# Patient Record
Sex: Female | Born: 1985 | Race: White | Hispanic: No | Marital: Single | Smoking: Never smoker
Health system: Southern US, Community
[De-identification: ages and names within clinical notes are randomized; demographics above are authoritative.]

## PROBLEM LIST (undated history)

## (undated) DIAGNOSIS — F419 Anxiety disorder, unspecified: Secondary | ICD-10-CM

## (undated) DIAGNOSIS — G510 Bell's palsy: Secondary | ICD-10-CM

## (undated) HISTORY — PX: COSMETIC SURGERY: SHX468

## (undated) HISTORY — DX: Bell's palsy: G51.0

## (undated) HISTORY — DX: Anxiety disorder, unspecified: F41.9

---
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
## 2008-01-22 ENCOUNTER — Encounter: Payer: Self-pay | Admitting: Obstetrics and Gynecology

## 2008-01-22 ENCOUNTER — Inpatient Hospital Stay (HOSPITAL_COMMUNITY): Admission: AD | Admit: 2008-01-22 | Discharge: 2008-01-23 | Payer: Self-pay | Admitting: Obstetrics and Gynecology

## 2008-01-25 ENCOUNTER — Inpatient Hospital Stay (HOSPITAL_COMMUNITY): Admission: AD | Admit: 2008-01-25 | Discharge: 2008-01-25 | Payer: Self-pay | Admitting: Family Medicine

## 2008-09-15 ENCOUNTER — Ambulatory Visit (HOSPITAL_COMMUNITY): Admission: RE | Admit: 2008-09-15 | Discharge: 2008-09-15 | Payer: Self-pay | Admitting: Obstetrics & Gynecology

## 2008-09-25 DIAGNOSIS — F419 Anxiety disorder, unspecified: Secondary | ICD-10-CM

## 2008-09-25 HISTORY — DX: Anxiety disorder, unspecified: F41.9

## 2008-10-12 ENCOUNTER — Ambulatory Visit (HOSPITAL_COMMUNITY): Admission: RE | Admit: 2008-10-12 | Discharge: 2008-10-12 | Payer: Self-pay | Admitting: Obstetrics & Gynecology

## 2008-11-12 ENCOUNTER — Ambulatory Visit (HOSPITAL_COMMUNITY): Admission: RE | Admit: 2008-11-12 | Discharge: 2008-11-12 | Payer: Self-pay | Admitting: Obstetrics & Gynecology

## 2008-11-30 ENCOUNTER — Emergency Department (HOSPITAL_COMMUNITY): Admission: EM | Admit: 2008-11-30 | Discharge: 2008-11-30 | Payer: Self-pay | Admitting: Emergency Medicine

## 2009-02-13 ENCOUNTER — Observation Stay (HOSPITAL_COMMUNITY): Admission: AD | Admit: 2009-02-13 | Discharge: 2009-02-13 | Payer: Self-pay | Admitting: Obstetrics & Gynecology

## 2009-02-16 ENCOUNTER — Inpatient Hospital Stay (HOSPITAL_COMMUNITY): Admission: AD | Admit: 2009-02-16 | Discharge: 2009-02-18 | Payer: Self-pay | Admitting: Obstetrics & Gynecology

## 2009-11-03 ENCOUNTER — Emergency Department (HOSPITAL_COMMUNITY): Admission: EM | Admit: 2009-11-03 | Discharge: 2009-11-03 | Payer: Self-pay | Admitting: Family Medicine

## 2010-05-27 ENCOUNTER — Emergency Department (HOSPITAL_COMMUNITY): Admission: AD | Admit: 2010-05-27 | Discharge: 2010-05-27 | Payer: Self-pay | Admitting: Family Medicine

## 2010-12-08 LAB — POCT RAPID STREP A (OFFICE): Streptococcus, Group A Screen (Direct): NEGATIVE

## 2011-01-03 LAB — COMPREHENSIVE METABOLIC PANEL
AST: 25 U/L (ref 0–37)
Albumin: 2.9 g/dL — ABNORMAL LOW (ref 3.5–5.2)
BUN: 4 mg/dL — ABNORMAL LOW (ref 6–23)
Calcium: 9 mg/dL (ref 8.4–10.5)
Creatinine, Ser: 0.54 mg/dL (ref 0.4–1.2)
GFR calc Af Amer: >60 mL/min (ref >60)

## 2011-01-03 LAB — CBC
HCT: 33.2 % — ABNORMAL LOW (ref 36.0–46.0)
HCT: 37.9 % (ref 36.0–46.0)
MCHC: 35.1 g/dL (ref 30.0–36.0)
MCHC: 35.1 g/dL (ref 30.0–36.0)
MCV: 89.2 fL (ref 78.0–100.0)
MCV: 90 fL (ref 78.0–100.0)
Platelets: 154 10*3/uL (ref 150–400)
Platelets: 167 10*3/uL (ref 150–400)
Platelets: 174 10*3/uL (ref 150–400)
RBC: 3.69 MIL/uL — ABNORMAL LOW (ref 3.87–5.11)
RDW: 14.1 % (ref 11.5–15.5)
RDW: 14.1 % (ref 11.5–15.5)
WBC: 11.1 10*3/uL — ABNORMAL HIGH (ref 4.0–10.5)

## 2011-01-03 LAB — URINALYSIS, DIPSTICK ONLY
Bilirubin Urine: NEGATIVE
Nitrite: NEGATIVE
Specific Gravity, Urine: 1.02 (ref 1.005–1.030)
pH: 6 (ref 5.0–8.0)

## 2011-01-03 LAB — CCBB MATERNAL DONOR DRAW

## 2011-01-03 LAB — URIC ACID: Uric Acid, Serum: 4.9 mg/dL (ref 2.4–7.0)

## 2011-01-03 LAB — RPR: RPR Ser Ql: NONREACTIVE

## 2011-02-07 NOTE — H&P (Signed)
Amanda Greer, Amanda Greer              ACCOUNT NO.:  [id number]   MEDICAL RECORD NO.:  [id number]          PATIENT TYPE:  OBV   LOCATION:  9198                          FACILITY:  [facility]   PHYSICIAN:  Roseanna Rainbow, M.D.DATE OF BIRTH:  [birth year]   DATE OF ADMISSION:  02/13/2009  DATE OF DISCHARGE:  02/13/2009                              HISTORY & PHYSICAL   CHIEF COMPLAINT:  The patient is a 25 year old para 0 with an estimated  date of confinement of Feb 11, 2009, with an intrauterine pregnancy at  40 plus weeks for induction of labor.   HISTORY OF PRESENT ILLNESS:  Please see the above.   ALLERGIES:  No known drug allergies.   MEDICATIONS:  Prenatal vitamins.   OBSTETRIC RISK FACTORS:  An ultrasound for an anatomic survey showed an  echogenic intracardiac focus.  GBS positive.   PRENATAL LABORATORY FINDINGS:  Blood type O+, antibody screen negative,  chlamydia probe negative, GC probe negative, 1-hour GCT 99, platelets  206,000, RPR nonreactive, rubella immune, varicella immune, GBS  positive.   PAST OBSTETRIC HISTORY:  History of 2 miscarriages or abortions.   PAST GYNECOLOGIC HISTORY:  Please see the above.   PAST MEDICAL HISTORY:  She denies.   PAST SURGICAL HISTORY:  There is a history of D&C.   SOCIAL HISTORY:  She works as a IT sales professional.  She is single, living  with the significant other.  She does not give any significant history  of alcohol usage.  She has no significant smoking history.  She denies  illicit drug use.   FAMILY HISTORY:  Noncontributory.   PHYSICAL EXAMINATION:  VITAL SIGNS:  Stable, afebrile.  Fetal heart  tracing reassuring.  Tocodynamometer, regular uterine contractions.  GENERAL:  No apparent distress.  ABDOMEN:  Gravid.  PELVIC:  Sterile vaginal exam, the cervix is 2 cm dilated, 90% effaced  with a vertex at a -1, 0 station.   ASSESSMENT:  Intrauterine pregnancy at term.  Favorable Bishop score for  induction of labor.  Fetal  heart tracing consistent with fetal well-  being.  Group B streptococcus positive.   PLAN:  Admission, low-dose Pitocin per protocol, penicillin, GBS  prophylaxis.      Roseanna Rainbow, M.D.  Electronically Signed     LAJ/MEDQ  D:  02/13/2009  T:  02/14/2009  Job:  [id number]

## 2011-06-20 LAB — WET PREP, GENITAL
Clue Cells Wet Prep HPF POC: NONE SEEN
Trich, Wet Prep: NONE SEEN
Yeast Wet Prep HPF POC: NONE SEEN

## 2011-06-20 LAB — POCT PREGNANCY, URINE: Operator id: 12921

## 2011-06-20 LAB — GC/CHLAMYDIA PROBE AMP, GENITAL: Chlamydia, DNA Probe: NEGATIVE

## 2014-06-04 ENCOUNTER — Encounter: Payer: Self-pay | Admitting: Physician Assistant

## 2014-06-24 ENCOUNTER — Encounter: Payer: Self-pay | Admitting: Physician Assistant

## 2014-06-24 ENCOUNTER — Ambulatory Visit (INDEPENDENT_AMBULATORY_CARE_PROVIDER_SITE_OTHER): Payer: BC Managed Care – PPO | Admitting: Physician Assistant

## 2014-06-24 VITALS — BP 120/76 | HR 96 | Temp 98.7°F | Resp 18 | Ht 65.25 in | Wt 155.0 lb

## 2014-06-24 DIAGNOSIS — D229 Melanocytic nevi, unspecified: Secondary | ICD-10-CM

## 2014-06-24 DIAGNOSIS — Z0000 Encounter for general adult medical examination without abnormal findings: Secondary | ICD-10-CM

## 2014-06-24 DIAGNOSIS — D239 Other benign neoplasm of skin, unspecified: Secondary | ICD-10-CM

## 2014-06-24 DIAGNOSIS — R635 Abnormal weight gain: Secondary | ICD-10-CM

## 2014-06-24 NOTE — Progress Notes (Signed)
Patient ID: Amanda Greer MRN: [id number], DOB: 1985/11/04, 28 y.o. Date of Encounter: 06/24/2014,   Chief Complaint: Physical (CPE)  HPI: 28 y.o. y/o white female  here for CPE.   She sees a gynecologist on a routine basis. Has GYN exam routinely there. Has IUD.  She says that she has only 2 concerns that she wanted to discuss today.  Says that she usually weighs 130 to 135. She thinks she has gained this weight over the past 3 months. Notes that she did start a new job about 4 months ago. Is now working at a bank. Says that she is sitting at this job and was up walking around at her prior job. Otherwise does not think there has been any other change in her activity level. Thinks that her diet has been the same.  Not sure why she is gaining this weight.  Also she states that her grandmother has told her that she has moles on her back that are worrying her. Wants to have these checked. Says that  a lot of family members in her dad's family have moles all over their skin.  No other complaints or concerns.   Review of Systems: Consitutional: No fever, chills, fatigue, night sweats, lymphadenopathy. No significant/unexplained weight changes. Eyes: No visual changes, eye redness, or discharge. ENT/Mouth: No ear pain, sore throat, nasal drainage, or sinus pain. Cardiovascular: No chest pressure,heaviness, tightness or squeezing, even with exertion. No increased shortness of breath or dyspnea on exertion.No palpitations, edema, orthopnea, PND. Respiratory: No cough, hemoptysis, SOB, or wheezing. Gastrointestinal: No anorexia, dysphagia, reflux, pain, nausea, vomiting, hematemesis, diarrhea, constipation, BRBPR, or melena. Breast: No mass, nodules, bulging, or retraction. No skin changes or inflammation. No nipple discharge. No lymphadenopathy. Genitourinary: No dysuria, hematuria, incontinence, vaginal discharge, pruritis, burning, abnormal bleeding, or pain. Musculoskeletal: No  decreased ROM, No joint pain or swelling. No significant pain in neck, back, or extremities. Skin: No rash, pruritis. Neurological: No headache, dizziness, syncope, seizures, tremors, memory loss, coordination problems, or paresthesias. Psychological: No anxiety, depression, hallucinations, SI/HI. Endocrine: No polydipsia, polyphagia, polyuria, or known diabetes.No increased fatigue. No palpitations/rapid heart rate. All other systems were reviewed and are otherwise negative.  Past Medical History  Diagnosis Date  . Anxiety 2010     History reviewed. No pertinent past surgical history.  Home Meds:  No outpatient prescriptions prior to visit.   No facility-administered medications prior to visit.    Allergies: No Known Allergies  History   Social History  . Marital Status: Single    Spouse Name: N/A    Number of Children: N/A  . Years of Education: N/A   Occupational History  . Not on file.   Social History Main Topics  . Smoking status: Never Smoker   . Smokeless tobacco: Never Used  . Alcohol Use: No  . Drug Use: No  . Sexual Activity: Yes    Birth Control/ Protection: IUD   Other Topics Concern  . Not on file   Social History Narrative   Entered 05/2014:    At home, It is patient's daughter, and daughter's father. It is the 3 of them at home.   Patient currently working at a bank.    Family History  Problem Relation Age of Onset  . Hyperlipidemia Mother   . Hypertension Mother     Physical Exam: Blood pressure 120/76, pulse 96, temperature 98.7 F (37.1 C), temperature source Oral, resp. rate 18, height 5' 5.25" (1.657 m), weight 155  lb (70.308 kg), last menstrual period 06/23/2014., Body mass index is 25.61 kg/(m^2). General: Well developed, well nourished, WF. Appears in no acute distress. HEENT: Normocephalic, atraumatic. Conjunctiva pink, sclera non-icteric. Pupils 2 mm constricting to 1 mm, round, regular, and equally reactive to light and  accomodation. EOMI. Internal auditory canal clear. TMs with good cone of light and without pathology. Nasal mucosa pink. Nares are without discharge. No sinus tenderness. Oral mucosa pink.  Pharynx without exudate.   Neck: Supple. Trachea midline. No thyromegaly. Full ROM. No lymphadenopathy.No Carotid Bruits. Lungs: Clear to auscultation bilaterally without wheezes, rales, or rhonchi. Breathing is of normal effort and unlabored. Cardiovascular: RRR with S1 S2. No murmurs, rubs, or gallops. Distal pulses 2+ symmetrically. No carotid or abdominal bruits. Breast: Deferred. Per Gyn. Abdomen: Soft, non-tender, non-distended with normoactive bowel sounds. No hepatosplenomegaly or masses. No rebound/guarding. No CVA tenderness. No hernias.  Genitourinary: Deferred. Per Gyn.  Musculoskeletal: Full range of motion and 5/5 strength throughout. Without swelling, atrophy, tenderness, crepitus, or warmth. Extremities without clubbing, cyanosis, or edema. Calves supple. Skin: Warm and moist without erythema, ecchymosis, wounds, or rash. She has nevi on all areas of her body--arms, chest, abdomen, back, etc. On her back, she has 3 suspicious nevi--with irregular borders, different shades of brown pigment. Neuro: A+Ox3. CN II-XII grossly intact. Moves all extremities spontaneously. Full sensation throughout. Normal gait. DTR 2+ throughout upper and lower extremities. Finger to nose intact. Psych:  Responds to questions appropriately with a normal affect.   Assessment/Plan:  28 y.o. y/o female here for CPE 1. Visit for preventive health examination  A. Screening Labs: Will  not check lipid panel. She is not fasting and do not think she needs to return fasting as she is only 28 years old. No cardiac risk factors. - CBC with Differential - COMPLETE METABOLIC PANEL WITH GFR - TSH  B. Pap: She has this with GYN.  F. Immunizations:  Influenza: Recommended this today but she defers. Tetanus: She states that  this is up to date. 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 Pneumococcal: No indication to receive this until age 28. Zostavax: Not recommended until age 26.  2. Weight gain - TSH Discussed that if her TSH is normal then the weight gain is secondary to age, hormones, diet and exercise.  If TSH is normal then she will need to improve her diet and exercise for weight management.  3. Multiple atypical nevi - Ambulatory referral to Dermatology   Signed, [place], [place], [facility] 06/24/2014 3:27 PM

## 2014-06-25 ENCOUNTER — Telehealth: Payer: Self-pay | Admitting: *Deleted

## 2014-06-25 LAB — CBC WITH DIFFERENTIAL/PLATELET
BASOS ABS: 0 10*3/uL (ref 0.0–0.1)
BASOS PCT: 0 % (ref 0–1)
Eosinophils Absolute: 0.4 10*3/uL (ref 0.0–0.7)
Eosinophils Relative: 6 % — ABNORMAL HIGH (ref 0–5)
HCT: 41.7 % (ref 36.0–46.0)
Hemoglobin: 14.3 g/dL (ref 12.0–15.0)
Lymphocytes Relative: 33 % (ref 12–46)
Lymphs Abs: 2 10*3/uL (ref 0.7–4.0)
MCH: 29.6 pg (ref 26.0–34.0)
MCHC: 34.3 g/dL (ref 30.0–36.0)
MCV: 86.3 fL (ref 78.0–100.0)
Monocytes Absolute: 0.7 10*3/uL (ref 0.1–1.0)
Monocytes Relative: 11 % (ref 3–12)
NEUTROS ABS: 3.1 10*3/uL (ref 1.7–7.7)
NEUTROS PCT: 50 % (ref 43–77)
Platelets: 250 10*3/uL (ref 150–400)
RBC: 4.83 MIL/uL (ref 3.87–5.11)
RDW: 13.2 % (ref 11.5–15.5)
WBC: 6.2 10*3/uL (ref 4.0–10.5)

## 2014-06-25 LAB — COMPLETE METABOLIC PANEL WITH GFR
ALBUMIN: 4.6 g/dL (ref 3.5–5.2)
ALK PHOS: 41 U/L (ref 39–117)
ALT: 17 U/L (ref 0–35)
AST: 17 U/L (ref 0–37)
BUN: 10 mg/dL (ref 6–23)
CO2: 25 mEq/L (ref 19–32)
Calcium: 9.4 mg/dL (ref 8.4–10.5)
Chloride: 103 mEq/L (ref 96–112)
Creat: 0.62 mg/dL (ref 0.50–1.10)
GFR, Est African American: >89 mL/min
GFR, Est Non African American: >89 mL/min
Glucose, Bld: 87 mg/dL (ref 70–99)
POTASSIUM: 3.8 meq/L (ref 3.5–5.3)
SODIUM: 139 meq/L (ref 135–145)
TOTAL PROTEIN: 7.1 g/dL (ref 6.0–8.3)
Total Bilirubin: 0.3 mg/dL (ref 0.2–1.2)

## 2014-06-25 LAB — TSH: TSH: 2.375 u[IU]/mL (ref 0.350–4.500)

## 2014-06-25 NOTE — Telephone Encounter (Signed)
LM to return call, pt has appt set up with Verlan Friends on Oct 22 at 10:20am with Dr. Elvera Lennox.

## 2014-06-26 NOTE — Telephone Encounter (Signed)
Lm on pt vm of appointment date time and location of appt

## 2014-06-30 ENCOUNTER — Encounter: Payer: Self-pay | Admitting: Family Medicine

## 2014-08-31 ENCOUNTER — Other Ambulatory Visit: Payer: Self-pay | Admitting: Dermatology

## 2014-11-03 ENCOUNTER — Other Ambulatory Visit: Payer: Self-pay | Admitting: Dermatology

## 2015-07-15 ENCOUNTER — Encounter: Payer: Self-pay | Admitting: Physician Assistant

## 2015-07-15 ENCOUNTER — Ambulatory Visit (INDEPENDENT_AMBULATORY_CARE_PROVIDER_SITE_OTHER): Payer: BLUE CROSS/BLUE SHIELD | Admitting: Physician Assistant

## 2015-07-15 VITALS — BP 114/78 | HR 88 | Temp 98.7°F | Resp 18 | Ht 65.0 in | Wt 166.0 lb

## 2015-07-15 DIAGNOSIS — Z0000 Encounter for general adult medical examination without abnormal findings: Secondary | ICD-10-CM | POA: Diagnosis not present

## 2015-07-15 DIAGNOSIS — Z124 Encounter for screening for malignant neoplasm of cervix: Secondary | ICD-10-CM | POA: Diagnosis not present

## 2015-07-15 DIAGNOSIS — Z01419 Encounter for gynecological examination (general) (routine) without abnormal findings: Secondary | ICD-10-CM

## 2015-07-15 NOTE — Progress Notes (Signed)
Patient ID: Amanda Greer MRN: [id number], DOB: 1986-07-16, 29 y.o. Date of Encounter: 07/15/2015,   Chief Complaint: Physical (CPE)  HPI: 29 y.o. y/o white female  here for CPE.   She states that she does have a gynecologist and does have an IUD. States that her IUD expires February 2017 and she does have an appointment with her gynecologist for them to change this out. However she says that she also has been keeping up with her Pap smears and knows that her last Pap smear was > 3 years ago and that she does need to go ahead and do Pap smear here today. We had a discussion of whether the gynecologist was doing this and had done this recently and she says again that she has been keeping up with this and is due for this today.  Again today she is frustrated with weight gain. She asked how much weight she has gained since her last visit here. Says that she feels that she is pretty careful with her diet and just doesn't understand why she is gaining weight at the rate she is.  Her daughter is now 44 years old.   Review of Systems: Consitutional: No fever, chills, fatigue, night sweats, lymphadenopathy. No significant/unexplained weight changes. Eyes: No visual changes, eye redness, or discharge. ENT/Mouth: No ear pain, sore throat, nasal drainage, or sinus pain. Cardiovascular: No chest pressure,heaviness, tightness or squeezing, even with exertion. No increased shortness of breath or dyspnea on exertion.No palpitations, edema, orthopnea, PND. Respiratory: No cough, hemoptysis, SOB, or wheezing. Gastrointestinal: No anorexia, dysphagia, reflux, pain, nausea, vomiting, hematemesis, diarrhea, constipation, BRBPR, or melena. Breast: No mass, nodules, bulging, or retraction. No skin changes or inflammation. No nipple discharge. No lymphadenopathy. Genitourinary: No dysuria, hematuria, incontinence, vaginal discharge, pruritis, burning, abnormal bleeding, or pain. Musculoskeletal: No  decreased ROM, No joint pain or swelling. No significant pain in neck, back, or extremities. Skin: No rash, pruritis, or concerning lesions. Neurological: No headache, dizziness, syncope, seizures, tremors, memory loss, coordination problems, or paresthesias. Psychological: No anxiety, depression, hallucinations, SI/HI. Endocrine: No polydipsia, polyphagia, polyuria, or known diabetes.No increased fatigue. No palpitations/rapid heart rate. No significant/unexplained weight change. All other systems were reviewed and are otherwise negative.  Past Medical History  Diagnosis Date  . Anxiety 2010     History reviewed. No pertinent past surgical history.  Home Meds:  No outpatient prescriptions prior to visit.   No facility-administered medications prior to visit.    Allergies: No Known Allergies  Social History   Social History  . Marital Status: Single    Spouse Name: N/A  . Number of Children: N/A  . Years of Education: N/A   Occupational History  . Not on file.   Social History Main Topics  . Smoking status: Never Smoker   . Smokeless tobacco: Never Used  . Alcohol Use: No  . Drug Use: No  . Sexual Activity: Yes    Birth Control/ Protection: IUD   Other Topics Concern  . Not on file   Social History Narrative   Entered 05/2014:    At home, It is patient's daughter, and daughter's father. It is the 3 of them at home.   Patient currently working at a bank.    Family History  Problem Relation Age of Onset  . Hyperlipidemia Mother   . Hypertension Mother     Physical Exam: Blood pressure 114/78, pulse 88, temperature 98.7 F (37.1 C), temperature source Oral, resp. rate 18, height 5'  5" (1.651 m), weight 166 lb (75.297 kg)., Body mass index is 27.62 kg/(m^2). General: Well developed, well nourished, WF. Appears in no acute distress. HEENT: Normocephalic, atraumatic. Conjunctiva pink, sclera non-icteric. Pupils 2 mm constricting to 1 mm, round, regular, and  equally reactive to light and accomodation. EOMI. Internal auditory canal clear. TMs with good cone of light and without pathology. Nasal mucosa pink. Nares are without discharge. No sinus tenderness. Oral mucosa pink.  Pharynx without exudate.   Neck: Supple. Trachea midline. No thyromegaly. Full ROM. No lymphadenopathy.No Carotid Bruits. Lungs: Clear to auscultation bilaterally without wheezes, rales, or rhonchi. Breathing is of normal effort and unlabored. Cardiovascular: RRR with S1 S2. No murmurs, rubs, or gallops. Distal pulses 2+ symmetrically. No carotid or abdominal bruits. Breast: Symmetrical. No masses. Nipples without discharge. Abdomen: Soft, non-tender, non-distended with normoactive bowel sounds. No hepatosplenomegaly or masses. No rebound/guarding. No CVA tenderness. No hernias.  Genitourinary:  External genitalia without lesions. Vaginal mucosa pink.No discharge present. Cervix pink and without discharge. No cervical tenderness.Normal uterus size. No adnexal mass or tenderness.  Pap smear taken. Musculoskeletal: Full range of motion and 5/5 strength throughout. Skin: Warm and moist without erythema, ecchymosis, wounds, or rash. Neuro: A+Ox3. CN II-XII grossly intact. Moves all extremities spontaneously. Full sensation throughout. Normal gait. Psych:  Responds to questions appropriately with a normal affect.   Assessment/Plan:  29 y.o. y/o female here for CPE  1. Visit for preventive health examination  A. Screening Labs: She had screening labs performed last year and these were normal. She does not want to repeat today.  B. Pap: - PAP, ThinPrep ASCUS Rflx HPV Rflx Type  C. Screening Mammogram: Not indicated at this age  D. DEXA/BMD:  Not indicated at this age  E. Colorectal Cancer Screening: Not indicated at this age  F. Immunizations:  Influenza:----- recommended influenza vaccine but she defers Tetanus:------ last tetanus was 09/25/2006----up to date---next one due  2018 Pneumococcal:--- She has no indication to need pneumonia vaccine until age 7 Zostavax:----------- not indicated until age 25   2. Encounter for cervical Pap smear with pelvic exam - PAP, ThinPrep ASCUS Rflx HPV Rflx Type   Signed, [address] [place], [place], [facility] 07/15/2015 9:42 AM

## 2015-07-16 ENCOUNTER — Encounter: Payer: Self-pay | Admitting: Family Medicine

## 2015-07-16 LAB — PAP THINPREP ASCUS RFLX HPV RFLX TYPE

## 2015-10-20 ENCOUNTER — Ambulatory Visit (INDEPENDENT_AMBULATORY_CARE_PROVIDER_SITE_OTHER): Payer: BLUE CROSS/BLUE SHIELD | Admitting: Physician Assistant

## 2015-10-20 ENCOUNTER — Encounter: Payer: Self-pay | Admitting: Physician Assistant

## 2015-10-20 VITALS — BP 110/78 | HR 80 | Temp 98.2°F | Resp 20 | Wt 165.0 lb

## 2015-10-20 DIAGNOSIS — L309 Dermatitis, unspecified: Secondary | ICD-10-CM | POA: Diagnosis not present

## 2015-10-20 MED ORDER — CLOTRIMAZOLE-BETAMETHASONE 1-0.05 % EX CREA: 1.0000 "application " | TOPICAL_CREAM | Freq: Two times a day (BID) | CUTANEOUS | Status: DC

## 2015-10-20 NOTE — Progress Notes (Signed)
    Patient ID: Amanda Greer MRN: [id number], DOB: 12/28/1985, 30 y.o. Date of Encounter: 10/20/2015, 8:57 AM    Chief Complaint:  Chief Complaint  Patient presents with  . Rash    on face under left chin and under nose and under both eyes since Oct and getting worse     HPI: 30 y.o. year old white female presents with above.  Says that she has been having this since October off and on. Says that some days are better than others. Says some days the rash seems "thick" scaly. On her chin and says sometimes it's even on her lips. Says that she has not changed any products. Says that she has only been using Vaseline and only using Carmex to the lips. Does not think it's any products that she is applying that is an irritant.     Home Meds:   No outpatient prescriptions prior to visit.   No facility-administered medications prior to visit.    Allergies: No Known Allergies    Review of Systems: See HPI for pertinent ROS. All other ROS negative.    Physical Exam: Blood pressure 110/78, pulse 80, temperature 98.2 F (36.8 C), temperature source Oral, resp. rate 20, weight 165 lb (74.844 kg)., Body mass index is 27.46 kg/(m^2). General:  WNWD. WF. Appears in no acute distress. Neck: Supple. No thyromegaly. No lymphadenopathy. Lungs: Clear bilaterally to auscultation without wheezes, rales, or rhonchi. Breathing is unlabored. Heart: Regular rhythm. No murmurs, rubs, or gallops. Msk:  Strength and tone normal for age. Skin: Chin: Currently, approx 2 cm area affected-- 1 cm area of diffuse light pink area, with small pink satellite lesions around lateral periphery. Currently lips normal.  Neuro: Alert and oriented X 3. Moves all extremities spontaneously. Gait is normal. CNII-XII grossly in tact. Psych:  Responds to questions appropriately with a normal affect.     ASSESSMENT AND PLAN:  30 y.o. year old female with  1. Dermatitis Apply Lotrisone twice a day. If does not resolve  with this, will consider dermatology referral. - clotrimazole-betamethasone (LOTRISONE) cream; Apply 1 application topically 2 (two) times daily.  Dispense: 30 g; Refill: 0   Signed, [address] [place], [place], [facility] 10/20/2015 8:57 AM

## 2015-11-16 ENCOUNTER — Ambulatory Visit (INDEPENDENT_AMBULATORY_CARE_PROVIDER_SITE_OTHER): Payer: BLUE CROSS/BLUE SHIELD | Admitting: Certified Nurse Midwife

## 2015-11-16 VITALS — BP 119/85 | HR 85 | Wt 164.0 lb

## 2015-11-16 DIAGNOSIS — Z30430 Encounter for insertion of intrauterine contraceptive device: Secondary | ICD-10-CM

## 2015-11-16 DIAGNOSIS — Z30433 Encounter for removal and reinsertion of intrauterine contraceptive device: Secondary | ICD-10-CM | POA: Diagnosis not present

## 2015-11-16 NOTE — Progress Notes (Signed)
Patient ID: Amanda Greer, female   DOB: 09-Jan-1986, 30 y.o.   MRN: [id number]  IUD Procedure Note   DIAGNOSIS: Desires long-term, reversible contraception   PROCEDURE: IUD removal and replacement Performing Provider: Kandis Cocking CNM  Patient counseled prior to procedure. I explained risks and benefits of Mirena IUD, reviewed alternative forms of contraception. Patient stated understanding and consented to continue with procedure.   LMP: unknown, Mirena Pregnancy Test: Negative Lot #: [id number] Expiration Date: 11/18   IUD type: [X]  Mirena   [  ] Paraguard  [  ] Isla Pence   [  ]  Kyleena  PROCEDURE:  Timeout procedure was performed to ensure right patient and right site.  A bimanual exam was performed to determine the position of the uterus, retroverted. The speculum was placed. The vagina and cervix was sterilized in the usual manner and sterile technique was maintained throughout the course of the procedure. A cyto brush was used along with long forceps to grasp IUD strings.  IUD strings visualized and IUD strings grasped with long forceps, removed intact.  A single toothed tenaculum was not required. The depth of the uterus was sounded to 9 cm. The IUD was inserted to the appropriate depth and inserted without difficulty.  The string was cut to an estimated 4 cm length. Bleeding was minimal. The patient tolerated the procedure well.   Follow up: The patient tolerated the procedure well without complications.  Standard post-procedure care is explained and return precautions are given.  Kandis Cocking CNM

## 2015-12-15 ENCOUNTER — Ambulatory Visit (INDEPENDENT_AMBULATORY_CARE_PROVIDER_SITE_OTHER): Payer: BLUE CROSS/BLUE SHIELD | Admitting: Certified Nurse Midwife

## 2015-12-15 ENCOUNTER — Encounter: Payer: Self-pay | Admitting: Certified Nurse Midwife

## 2015-12-15 VITALS — BP 115/88 | HR 80 | Wt 164.0 lb

## 2015-12-15 DIAGNOSIS — Z30431 Encounter for routine checking of intrauterine contraceptive device: Secondary | ICD-10-CM | POA: Diagnosis not present

## 2015-12-15 DIAGNOSIS — R399 Unspecified symptoms and signs involving the genitourinary system: Secondary | ICD-10-CM | POA: Diagnosis not present

## 2015-12-15 LAB — POCT URINALYSIS DIPSTICK
Bilirubin, UA: NEGATIVE
GLUCOSE UA: NEGATIVE
Ketones, UA: NEGATIVE
NITRITE UA: NEGATIVE
Spec Grav, UA: 1.02
UROBILINOGEN UA: NEGATIVE
pH, UA: 5

## 2015-12-15 NOTE — Progress Notes (Signed)
Patient ID: Amanda Greer, female   DOB: 1986-02-16, 30 y.o.   MRN: [id number]  Chief Complaint  Patient presents with  . Follow-up    IUD check-Mirena    HPI Amanda Greer Amanda Greer is a 30 y.o. female.  Here for 1 mo. F/u on Mirena IUD.  Desires to have strings trimmed a little bit shorter.  Partner is feeling them during sexual intercourse.  States that she is not having any problems with the IUD. Has had 2 episodes of spotting since insertion.    HPI  Past Medical History  Diagnosis Date  . Anxiety 2010    No past surgical history on file.  Family History  Problem Relation Age of Onset  . Hyperlipidemia Mother   . Hypertension Mother     Social History Social History  Substance Use Topics  . Smoking status: Never Smoker   . Smokeless tobacco: Never Used  . Alcohol Use: No    No Known Allergies  Current Outpatient Prescriptions  Medication Sig Dispense Refill  . clotrimazole-betamethasone (LOTRISONE) cream Apply 1 application topically 2 (two) times daily. (Patient not taking: Reported on 11/16/2015) 30 g 0   No current facility-administered medications for this visit.    Review of Systems Review of Systems Constitutional: negative for fatigue and weight loss Respiratory: negative for cough and wheezing Cardiovascular: negative for chest pain, fatigue and palpitations Gastrointestinal: negative for abdominal pain and change in bowel habits Genitourinary:negative Integument/breast: negative for nipple discharge Musculoskeletal:negative for myalgias Neurological: negative for gait problems and tremors Behavioral/Psych: negative for abusive relationship, depression Endocrine: negative for temperature intolerance     Blood pressure 115/88, pulse 80, weight 164 lb (74.39 kg).  Physical Exam Physical Exam General:   alert  Skin:   no rash or abnormalities  Lungs:   clear to auscultation bilaterally  Heart:   regular rate and rhythm, S1, S2 normal, no murmur, click,  rub or gallop  Breasts:   deferred  Abdomen:  normal findings: no organomegaly, soft, non-tender and no hernia  Pelvis:  External genitalia: normal general appearance Urinary system: urethral meatus normal and bladder without fullness, nontender Vaginal: normal without tenderness, induration or masses Cervix: normal appearance, + IUD strings present Adnexa: normal bimanual exam Uterus: anteverted and non-tender, normal size    50% of 15 min visit spent on counseling and coordination of care.   Data Reviewed Previous medical hx, meds, labs  Assessment     Hematuria IUD check up, in place     Plan    Orders Placed This Encounter  Procedures  . Urine culture  . POCT urinalysis dipstick   No orders of the defined types were placed in this encounter.    Follow up as needed or in 1 year for annual exam.

## 2015-12-16 LAB — URINE CULTURE

## 2016-07-17 ENCOUNTER — Encounter: Payer: Self-pay | Admitting: Physician Assistant

## 2016-07-17 ENCOUNTER — Ambulatory Visit (INDEPENDENT_AMBULATORY_CARE_PROVIDER_SITE_OTHER): Payer: BLUE CROSS/BLUE SHIELD | Admitting: Physician Assistant

## 2016-07-17 VITALS — BP 128/88 | HR 108 | Temp 99.0°F | Resp 16 | Wt 170.0 lb

## 2016-07-17 DIAGNOSIS — F411 Generalized anxiety disorder: Secondary | ICD-10-CM

## 2016-07-17 DIAGNOSIS — Z0000 Encounter for general adult medical examination without abnormal findings: Secondary | ICD-10-CM | POA: Diagnosis not present

## 2016-07-17 DIAGNOSIS — F410 Panic disorder [episodic paroxysmal anxiety] without agoraphobia: Secondary | ICD-10-CM | POA: Diagnosis not present

## 2016-07-17 LAB — CBC WITH DIFFERENTIAL/PLATELET
BASOS ABS: 0 {cells}/uL (ref 0–200)
Basophils Relative: 0 %
EOS PCT: 4 %
Eosinophils Absolute: 256 cells/uL (ref 15–500)
HCT: 43 % (ref 35.0–45.0)
Hemoglobin: 14.5 g/dL (ref 12.0–15.0)
LYMPHS ABS: 1664 {cells}/uL (ref 850–3900)
Lymphocytes Relative: 26 %
MCH: 29.7 pg (ref 27.0–33.0)
MCHC: 33.7 g/dL (ref 32.0–36.0)
MCV: 88.1 fL (ref 80.0–100.0)
MONOS PCT: 8 %
MPV: 11.5 fL (ref 7.5–12.5)
Monocytes Absolute: 512 cells/uL (ref 200–950)
NEUTROS ABS: 3968 {cells}/uL (ref 1500–7800)
NEUTROS PCT: 62 %
PLATELETS: 270 10*3/uL (ref 140–400)
RBC: 4.88 MIL/uL (ref 3.80–5.10)
RDW: 13.4 % (ref 11.0–15.0)
WBC: 6.4 10*3/uL (ref 3.8–10.8)

## 2016-07-17 LAB — TSH: TSH: 3.78 m[IU]/L

## 2016-07-17 MED ORDER — SERTRALINE HCL 50 MG PO TABS: 50.0000 mg | ORAL_TABLET | Freq: Every day | ORAL | 1 refills | Status: DC

## 2016-07-17 MED ORDER — CLONAZEPAM 0.5 MG PO TABS: 0.5000 mg | ORAL_TABLET | Freq: Two times a day (BID) | ORAL | 1 refills | Status: DC | PRN

## 2016-07-17 NOTE — Progress Notes (Signed)
Patient ID: Amanda Greer MRN: [id number], DOB: Mar 26, 1986, 30 y.o. Date of Encounter: 07/17/2016,   Chief Complaint: Physical (CPE)  HPI: 30 y.o. y/o female  here for CPE.   Today when depression screen was performed she had a high score on this. I then noted this to patient and she started to become teary-eyed. Says that she has been having panic attacks over the last couple of months.  Says that the first one was in August. At that time she was in a restaurant with a live band. Her hands started getting sweaty, she felt like she was going to throw up. They had to leave. Says that sometimes she is just lying in bed and will suddenly have a panic attack -- feels like she cannot breathe. Says now she is afraid to go out to eat because is afraid it will happen again. Says that she has an 34-year-old daughter and needs to go do things with her but is afraid to go out of the house and afraid that she will have a panic attack somewhere and it will be embarrassing. Says that sometimes her mind starts racing and it will not stop and she feels anxious. Says that yesterday morning she was just cooking breakfast and felt high anxiety and could not control her mind. Says that everything with her life is good and she has nothing to be worried and anxious about. 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 Says they have bought a home and that is good. Says that she is not married because she chooses not to be-- but that she is with the same man for 11 years and that that is a monogamous relationship and they have a very strong good relationship. She has an 69-year-old daughter who is healthy and does very well in school and says that everything is good and she has nothing to be worried about. Says in the past she has been through things that are stressful--  buying a house etc. things that should have caused anxiety didn't and now everything is totally stable she has no reason to be  feeling this way. She says that when she is at work working as a Customer service manager she is okay because she is focused on her work and so her mind is staying busy and focused. However when she is at home she is constantly worried and afraid of having panic attack. Says that she has been reading about this and has started taking some B vitamins and has been doing aromatherapy and reads books to try to calm herself. However these measures are not controlling the panic and anxiety.  She has no other complaints or concerns today.  She did see gynecologist in [place] and they changed out her IUD. Does not need GYN exam today as she has had that with her gynecologist.  Review of Systems: Consitutional: No fever, chills, fatigue, night sweats, lymphadenopathy. No significant/unexplained weight changes. Eyes: No visual changes, eye redness, or discharge. ENT/Mouth: No ear pain, sore throat, nasal drainage, or sinus pain. Cardiovascular: No chest pressure,heaviness, tightness or squeezing, even with exertion. No increased shortness of breath or dyspnea on exertion.No palpitations, edema, orthopnea, PND. Respiratory: No cough, hemoptysis, SOB, or wheezing. Gastrointestinal: No anorexia, dysphagia, reflux, pain, nausea, vomiting, hematemesis, diarrhea, constipation, BRBPR, or melena. Breast: No mass, nodules, bulging, or retraction. No skin changes or inflammation. No nipple discharge. No lymphadenopathy. Genitourinary: No dysuria, hematuria, incontinence, vaginal discharge, pruritis, burning, abnormal  bleeding, or pain. Musculoskeletal: No decreased ROM, No joint pain or swelling. No significant pain in neck, back, or extremities. Skin: No rash, pruritis, or concerning lesions. Neurological: No headache, dizziness, syncope, seizures, tremors, memory loss, coordination problems, or paresthesias. Psychological: No  hallucinations, SI/HI. See HPI Endocrine: No polydipsia, polyphagia, polyuria, or known  diabetes.No increased fatigue. No palpitations/rapid heart rate. No significant/unexplained weight change. All other systems were reviewed and are otherwise negative.  Past Medical History:  Diagnosis Date  . Anxiety 2010     No past surgical history on file.  Home Meds:  Outpatient Medications Prior to Visit  Medication Sig Dispense Refill  . clotrimazole-betamethasone (LOTRISONE) cream Apply 1 application topically 2 (two) times daily. (Patient not taking: Reported on 07/17/2016) 30 g 0   No facility-administered medications prior to visit.     Allergies: No Known Allergies  Social History   Social History  . Marital status: Single    Spouse name: N/A  . Number of children: N/A  . Years of education: N/A   Occupational History  . Not on file.   Social History Main Topics  . Smoking status: Never Smoker  . Smokeless tobacco: Never Used  . Alcohol use No  . Drug use: No  . Sexual activity: Yes    Birth control/ protection: IUD   Other Topics Concern  . Not on file   Social History Narrative   Entered 05/2014:    At home, It is patient's daughter, and daughter's father. It is the 3 of them at home.   Patient currently working at a bank.    Family History  Problem Relation Age of Onset  . Hyperlipidemia Mother   . Hypertension Mother     Physical Exam: Blood pressure 128/88, pulse (!) 108, temperature 99 F (37.2 C), temperature source Oral, resp. rate 16, weight 170 lb (77.1 kg), SpO2 99 %., Body mass index is 28.29 kg/m. General: Well developed, well nourished,WF. Appears in no acute distress. HEENT: Normocephalic, atraumatic. Conjunctiva pink, sclera non-icteric. Pupils 2 mm constricting to 1 mm, round, regular, and equally reactive to light and accomodation. EOMI. Internal auditory canal clear. TMs with good cone of light and without pathology. Nasal mucosa pink. Nares are without discharge. No sinus tenderness. Oral mucosa pink.  Pharynx without exudate.      Neck: Supple. Trachea midline. No thyromegaly. Full ROM. No lymphadenopathy.No Carotid Bruits. Lungs: Clear to auscultation bilaterally without wheezes, rales, or rhonchi. Breathing is of normal effort and unlabored. Cardiovascular: RRR with S1 S2. No murmurs, rubs, or gallops. Distal pulses 2+ symmetrically. No carotid or abdominal bruits. Breast: Per Gyn Abdomen: Soft, non-tender, non-distended with normoactive bowel sounds. No hepatosplenomegaly or masses. No rebound/guarding. No CVA tenderness. No hernias.  Genitourinary:  Per Gyn. Musculoskeletal: Full range of motion and 5/5 strength throughout.  Skin: Warm and moist without erythema, ecchymosis, wounds, or rash. Neuro: A+Ox3. CN II-XII grossly intact. Moves all extremities spontaneously. Full sensation throughout. Normal gait. DTR 2+ throughout upper and lower extremities. Psych: She is teary-eyed talking about her anxiety, panic. Otherwise is very appropriate.  Responds to questions appropriately.   Assessment/Plan:  30 y.o. y/o female here for CPE  1. Encounter for preventive health examination  A. Screening Labs:  She does want to recheck screening labs today. He had very small amount of apple juice this morning otherwise is fasting. - CBC with Differential/Platelet - COMPLETE METABOLIC PANEL WITH GFR - Lipid panel - TSH - VITAMIN D 25 Hydroxy (Vit-D  Deficiency, Fractures)  B. Pap: She had Pap smear here 07/15/15 which was negative. She has seen gynecologist in January/February 2017.  C. Screening Mammogram: Not indicated until at least age 78 D. DEXA/BMD:  Not indicated until around age 40 E. Colorectal Cancer Screening: Not indicated until age 68 F. Immunizations:  Influenza:----- recommended today but she defers Tetanus: This is up to date Pneumococcal: She has no indication to require pneumonia vaccine until age 43 Zostavax: Not indicated until age 45   2. Panic disorder Discussed proper expectations of  each medication at length. Discussed that she is to take the Zoloft daily until follow-up visit in 6 weeks. If this is causing any adverse effects and she is to call me immediately. Otherwise even if she does not think this is being effective she is to continue this until her follow-up visit with me in 6 weeks. She can use the Klonopin as needed for anxiety/panic. If she takes 1 and then 15 minutes later has felt no effect can take another one. - sertraline (ZOLOFT) 50 MG tablet; Take 1 tablet (50 mg total) by mouth daily.  Dispense: 30 tablet; Refill: 1 - clonazePAM (KLONOPIN) 0.5 MG tablet; Take 1 tablet (0.5 mg total) by mouth 2 (two) times daily as needed for anxiety.  Dispense: 20 tablet; Refill: 1  3. Generalized anxiety disorder Discussed proper expectations of each medication at length. Discussed that she is to take the Zoloft daily until follow-up visit in 6 weeks. If this is causing any adverse effects and she is to call me immediately. Otherwise even if she does not think this is being effective she is to continue this until her follow-up visit with me in 6 weeks. She can use the Klonopin as needed for anxiety/panic. If she takes 1 and then 15 minutes later has felt no effect can take another one.  - sertraline (ZOLOFT) 50 MG tablet; Take 1 tablet (50 mg total) by mouth daily.  Dispense: 30 tablet; Refill: 1 - clonazePAM (KLONOPIN) 0.5 MG tablet; Take 1 tablet (0.5 mg total) by mouth 2 (two) times daily as needed for anxiety.  Dispense: 20 tablet; Refill: 1    Follow up office visit 6 weeks or sooner if needed.  Signed, [address] [place], [place], [facility] 07/17/2016 9:29 AM

## 2016-07-18 LAB — COMPLETE METABOLIC PANEL WITH GFR
ALT: 17 U/L (ref 6–29)
AST: 17 U/L (ref 10–30)
Albumin: 4.3 g/dL (ref 3.6–5.1)
Alkaline Phosphatase: 45 U/L (ref 33–115)
BUN: 13 mg/dL (ref 7–25)
CHLORIDE: 103 mmol/L (ref 98–110)
CO2: 27 mmol/L (ref 20–31)
Calcium: 9.2 mg/dL (ref 8.6–10.2)
Creat: 0.83 mg/dL (ref 0.50–1.10)
GFR, Est African American: >89 mL/min (ref >=60)
GLUCOSE: 92 mg/dL (ref 70–99)
POTASSIUM: 3.9 mmol/L (ref 3.5–5.3)
SODIUM: 140 mmol/L (ref 135–146)
Total Bilirubin: 0.5 mg/dL (ref 0.2–1.2)
Total Protein: 7.1 g/dL (ref 6.1–8.1)

## 2016-07-18 LAB — LIPID PANEL
CHOL/HDL RATIO: 4.3 ratio (ref <=5.0)
Cholesterol: 179 mg/dL (ref 125–200)
HDL: 42 mg/dL — AB (ref >=46)
LDL CALC: 110 mg/dL (ref <130)
Triglycerides: 137 mg/dL (ref <150)
VLDL: 27 mg/dL (ref <30)

## 2016-07-18 LAB — VITAMIN D 25 HYDROXY (VIT D DEFICIENCY, FRACTURES): Vit D, 25-Hydroxy: 23 ng/mL — ABNORMAL LOW (ref 30–100)

## 2016-08-28 ENCOUNTER — Encounter: Payer: Self-pay | Admitting: Physician Assistant

## 2016-08-28 ENCOUNTER — Ambulatory Visit (INDEPENDENT_AMBULATORY_CARE_PROVIDER_SITE_OTHER): Payer: BLUE CROSS/BLUE SHIELD | Admitting: Physician Assistant

## 2016-08-28 VITALS — BP 120/88 | HR 83 | Temp 98.3°F | Resp 16 | Wt 174.0 lb

## 2016-08-28 DIAGNOSIS — L700 Acne vulgaris: Secondary | ICD-10-CM | POA: Diagnosis not present

## 2016-08-28 DIAGNOSIS — F411 Generalized anxiety disorder: Secondary | ICD-10-CM | POA: Diagnosis not present

## 2016-08-28 DIAGNOSIS — F410 Panic disorder [episodic paroxysmal anxiety] without agoraphobia: Secondary | ICD-10-CM

## 2016-08-28 MED ORDER — SERTRALINE HCL 50 MG PO TABS: 50.0000 mg | ORAL_TABLET | Freq: Every day | ORAL | 1 refills | Status: DC

## 2016-08-28 MED ORDER — MINOCYCLINE HCL 50 MG PO CAPS: 50.0000 mg | ORAL_CAPSULE | Freq: Two times a day (BID) | ORAL | 0 refills | Status: DC

## 2016-08-28 NOTE — Progress Notes (Signed)
Patient ID: Amanda Greer MRN: [id number], DOB: April 27, 1986, 30 y.o. Date of Encounter: 08/28/2016, 8:43 AM    Chief Complaint:  Chief Complaint  Patient presents with  . Anxiety    f/u     HPI: 30 y.o. year old female   She was here for CPE 07/17/2016----The following is from that OV note: when depression screen was performed she had a high score on this. I then noted this to patient and she started to become teary-eyed. Says that she has been having panic attacks over the last couple of months.  Says that the first one was in August. At that time she was in a restaurant with a live band. Her hands started getting sweaty, she felt like she was going to throw up. They had to leave. Says that sometimes she is just lying in bed and will suddenly have a panic attack -- feels like she cannot breathe. Says now she is afraid to go out to eat because is afraid it will happen again. Says that she has an 6-year-old daughter and needs to go do things with her but is afraid to go out of the house and afraid that she will have a panic attack somewhere and it will be embarrassing. Says that sometimes her mind starts racing and it will not stop and she feels anxious. Says that yesterday morning she was just cooking breakfast and felt high anxiety and could not control her mind. Says that everything with her life is good and she has nothing to be worried and anxious about. 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 Says they have bought a home and that is good. Says that she is not married because she chooses not to be-- but that she is with the same man for 11 years and that that is a monogamous relationship and they have a very strong good relationship. She has an 31-year-old daughter who is healthy and does very well in school and says that everything is good and she has nothing to be worried about. Says in the past she has been through things that are stressful--  buying a house  etc. things that should have caused anxiety didn't and now everything is totally stable she has no reason to be feeling this way. She says that when she is at work working as a Customer service manager she is okay because she is focused on her work and so her mind is staying busy and focused. However when she is at home she is constantly worried and afraid of having panic attack. Says that she has been reading about this and has started taking some B vitamins and has been doing aromatherapy and reads books to try to calm herself. However these measures are not controlling the panic and anxiety.  At that OV--I prescribed Zoloft 50mg  QD and also Klonopin to use PRN.   08/28/2016: Today she says that she has been feeling so much better since starting the Zoloft. Says that she wishes she had known to get on that medicine earlier. Says that she is feeling so much better. In general does not feel as anxious. Has not had anymore of the bad panic attacks since her last visit. Says she feels like she is even more patient with her daughter now and even her coworkers have noted her that she seems so much happier. Says that she hasn't needed any of the Klonopin. Says that she's a little scared to take  that type medicine but is glad to have it available to know that she has it if she were to need it.  Also is wanting something to treat acne that this on her left cheek that will not go away. Says that as a teenager she never had problems with acne never had much issues with her skin but now she has this area on her left cheek that is really bad it will not clear up. She has been using some topical medicines. Says that she washes her pillowcase every Sunday. Has not been drinking sodas etc. has done everything she can but is not clearing up.  Home Meds:   Outpatient Medications Prior to Visit  Medication Sig Dispense Refill  . clonazePAM (KLONOPIN) 0.5 MG tablet Take 1 tablet (0.5 mg total) by mouth 2 (two) times daily as needed for  anxiety. 20 tablet 1  . clotrimazole-betamethasone (LOTRISONE) cream Apply 1 application topically 2 (two) times daily. 30 g 0  . sertraline (ZOLOFT) 50 MG tablet Take 1 tablet (50 mg total) by mouth daily. 30 tablet 1   No facility-administered medications prior to visit.     Allergies: No Known Allergies    Review of Systems: See HPI for pertinent ROS. All other ROS negative.    Physical Exam: Blood pressure 120/88, pulse 83, temperature 98.3 F (36.8 C), temperature source Oral, resp. rate 16, weight 174 lb (78.9 kg), SpO2 98 %., Body mass index is 28.96 kg/m. General: WNWD WF.  Appears in no acute distress. Neck: Supple. No thyromegaly. No lymphadenopathy. Lungs: Clear bilaterally to auscultation without wheezes, rales, or rhonchi. Breathing is unlabored. Heart: Regular rhythm. No murmurs, rubs, or gallops. Msk:  Strength and tone normal for age. Skin: Face: Left cheek with cystic acne and papules. Few small papules on forehead, between eyebrows Neuro: Alert and oriented X 3. Moves all extremities spontaneously. Gait is normal. CNII-XII grossly in tact. Psych:  Responds to questions appropriately with a normal affect.     ASSESSMENT AND PLAN:  30 y.o. year old female with  1. Panic disorder This is much improved and now stable/controlled. Will continue current dose of Zoloft 50 mg. Informed her that this does come in a higher dose so if in the future she ever feels that this is not well controlled to let me know and we could consider increasing the dose if needed. She has the Klonopin available to use if needed. - sertraline (ZOLOFT) 50 MG tablet; Take 1 tablet (50 mg total) by mouth daily.  Dispense: 90 tablet; Refill: 1  2. Generalized anxiety disorder This is much improved and now stable/controlled. Will continue current dose of Zoloft 50 mg. Informed her that this does come in a higher dose so if in the future she ever feels that this is not well controlled to let me know  and we could consider increasing the dose if needed. She has the Klonopin available to use if needed.  - sertraline (ZOLOFT) 50 MG tablet; Take 1 tablet (50 mg total) by mouth daily.  Dispense: 90 tablet; Refill: 1  3. Acne vulgaris Will have her take medicine for 1 month and see if this will get this area of acne cleared. - minocycline (MINOCIN,DYNACIN) 50 MG capsule; Take 1 capsule (50 mg total) by mouth 2 (two) times daily.  Dispense: 60 capsule; Refill: 0  Routine f/u office visit 6 months or sooner if needed.  Marin Olp [place], [place], [facility] 08/28/2016 8:43 AM

## 2016-10-10 ENCOUNTER — Ambulatory Visit
Admission: RE | Admit: 2016-10-10 | Discharge: 2016-10-10 | Disposition: A | Discharge status: Home / Self Care | Payer: BLUE CROSS/BLUE SHIELD | Source: Ambulatory Visit | Attending: Family Medicine | Admitting: Family Medicine

## 2016-10-10 ENCOUNTER — Ambulatory Visit (INDEPENDENT_AMBULATORY_CARE_PROVIDER_SITE_OTHER): Payer: BLUE CROSS/BLUE SHIELD | Admitting: Family Medicine

## 2016-10-10 ENCOUNTER — Encounter: Payer: Self-pay | Admitting: Family Medicine

## 2016-10-10 VITALS — BP 170/100 | HR 106 | Temp 99.2°F | Resp 17 | Ht 67.0 in | Wt 172.0 lb

## 2016-10-10 DIAGNOSIS — G510 Bell's palsy: Secondary | ICD-10-CM

## 2016-10-10 DIAGNOSIS — R29810 Facial weakness: Secondary | ICD-10-CM | POA: Diagnosis not present

## 2016-10-10 DIAGNOSIS — R200 Anesthesia of skin: Secondary | ICD-10-CM

## 2016-10-10 MED ORDER — PREDNISONE 20 MG PO TABS: 60.0000 mg | ORAL_TABLET | Freq: Every day | ORAL | 0 refills | Status: DC

## 2016-10-10 MED ORDER — VALACYCLOVIR HCL 1 G PO TABS: 1000.0000 mg | ORAL_TABLET | Freq: Three times a day (TID) | ORAL | 0 refills | Status: DC

## 2016-10-10 MED ORDER — GADOBENATE DIMEGLUMINE 529 MG/ML IV SOLN
16.0000 mL | Freq: Once | INTRAVENOUS | Status: AC | PRN
  Administered 2016-10-10: 16 mL via INTRAVENOUS

## 2016-10-10 NOTE — Progress Notes (Signed)
Subjective:    Patient ID: Amanda Greer, female    DOB: 1985/10/05, 31 y.o.   MRN: [id number]  HPI Patient is extremely anxious. Beginning Saturday she had a dull occipital headache which has slowly gone away. However Sunday she developed numbness and a strange sensation on the right side of her face. Yesterday she developed muscle weakness on the right side of her face. Her lips are drooping. Her right eyelids are drooping. She is unable to completely close her right eye. Her right eye is itching and is irritated due to dryness. The forehead muscles on the right side of her head are also drooping. This appears to be a peripheral lesion however she does have diminished sensation on the right side of her face to 10 g monofilament as well as to cold which would be separate from the seventh cranial nerve. This is the only unusual feature of her symptoms. There is no evidence of weakness in her arms or in her legs. Cerebellar exam is completely normal. She has no risk factor for stroke. Past Medical History:  Diagnosis Date  . Anxiety 2010   No past surgical history on file. Current Outpatient Prescriptions on File Prior to Visit  Medication Sig Dispense Refill  . clonazePAM (KLONOPIN) 0.5 MG tablet Take 1 tablet (0.5 mg total) by mouth 2 (two) times daily as needed for anxiety. 20 tablet 1  . clotrimazole-betamethasone (LOTRISONE) cream Apply 1 application topically 2 (two) times daily. 30 g 0  . sertraline (ZOLOFT) 50 MG tablet Take 1 tablet (50 mg total) by mouth daily. 90 tablet 1  . minocycline (MINOCIN,DYNACIN) 50 MG capsule Take 1 capsule (50 mg total) by mouth 2 (two) times daily. (Patient not taking: Reported on 10/10/2016) 60 capsule 0   No current facility-administered medications on file prior to visit.    No Known Allergies Social History   Social History  . Marital status: Single    Spouse name: N/A  . Number of children: N/A  . Years of education: N/A   Occupational  History  . Not on file.   Social History Main Topics  . Smoking status: Never Smoker  . Smokeless tobacco: Never Used  . Alcohol use No  . Drug use: No  . Sexual activity: Yes    Birth control/ protection: IUD   Other Topics Concern  . Not on file   Social History Narrative   Entered 05/2014:    At home, It is patient's daughter, and daughter's father. It is the 3 of them at home.   Patient currently working at a bank.      Review of Systems  All other systems reviewed and are negative.      Objective:   Physical Exam  Constitutional: She is oriented to person, place, and time. She appears well-developed and well-nourished.  HENT:  Right Ear: External ear normal.  Left Ear: External ear normal.  Nose: Nose normal.  Mouth/Throat: Oropharynx is clear and moist. No oropharyngeal exudate.  Eyes: Conjunctivae and EOM are normal. Pupils are equal, round, and reactive to light.  Neck: Normal range of motion. Neck supple.  Cardiovascular: Regular rhythm and normal heart sounds.  Tachycardia present.   Pulmonary/Chest: Effort normal and breath sounds normal.  Neurological: She is alert and oriented to person, place, and time. She has normal reflexes. She displays no atrophy and no tremor. A cranial nerve deficit and sensory deficit is present. She exhibits normal muscle tone. She displays a negative Romberg  sign. She displays no seizure activity. Coordination and gait normal. GCS eye subscore is 4. GCS verbal subscore is 5. GCS motor subscore is 6.  Vitals reviewed.   Patient has a seventh nerve palsy. However she also has some mild numbness on the right side of her face which is slightly atypical. Her heart rate and her blood pressure elevated however she is extremely scared      Assessment & Plan:  Bell's palsy - Plan: valACYclovir (VALTREX) 1000 MG tablet, predniSONE (DELTASONE) 20 MG tablet  I believe this is most likely Bell's palsy. Begin prednisone 60 mg a day for 1  week in addition to Valtrex 1000 mg by mouth 3 times a day for one week. Because she is having some numbness on the right side of her face, I cannot completely exclude a central lesion affecting the fifth cranial nerve is well which would be atypical for Bell's palsy. Therefore I will proceed with an MRI of the brain however the most likely diagnosis is Bell's palsy

## 2016-12-15 ENCOUNTER — Ambulatory Visit (INDEPENDENT_AMBULATORY_CARE_PROVIDER_SITE_OTHER): Payer: BLUE CROSS/BLUE SHIELD | Admitting: Certified Nurse Midwife

## 2016-12-15 ENCOUNTER — Encounter: Payer: Self-pay | Admitting: Certified Nurse Midwife

## 2016-12-15 VITALS — BP 126/89 | HR 90 | Temp 97.7°F | Wt 172.8 lb

## 2016-12-15 DIAGNOSIS — L709 Acne, unspecified: Secondary | ICD-10-CM

## 2016-12-15 DIAGNOSIS — Z87898 Personal history of other specified conditions: Secondary | ICD-10-CM

## 2016-12-15 DIAGNOSIS — Z0000 Encounter for general adult medical examination without abnormal findings: Secondary | ICD-10-CM | POA: Diagnosis not present

## 2016-12-15 DIAGNOSIS — Z113 Encounter for screening for infections with a predominantly sexual mode of transmission: Secondary | ICD-10-CM

## 2016-12-15 DIAGNOSIS — Z01419 Encounter for gynecological examination (general) (routine) without abnormal findings: Secondary | ICD-10-CM

## 2016-12-15 DIAGNOSIS — Z872 Personal history of diseases of the skin and subcutaneous tissue: Secondary | ICD-10-CM

## 2016-12-15 DIAGNOSIS — Z8742 Personal history of other diseases of the female genital tract: Secondary | ICD-10-CM

## 2016-12-15 DIAGNOSIS — Z30431 Encounter for routine checking of intrauterine contraceptive device: Secondary | ICD-10-CM

## 2016-12-15 NOTE — Progress Notes (Signed)
Subjective:        Amanda Greer is a 31 y.o. female here for a routine exam.  Current complaints: acne, recent dx of bells palsey, pain with sexual intercourse, recent dx of depression, no change in weight or stress levels.  Is working.  Not currently exercising.  Same partner for 12 years.  Does desire STD work up.      Personal health questionnaire:  Is patient Ashkenazi Jewish, have a family history of breast and/or ovarian cancer: no Is there a family history of uterine cancer diagnosed at age < 45, gastrointestinal cancer, urinary tract cancer, family member who is a Field seismologist syndrome-associated carrier: no Is the patient overweight and hypertensive, family history of diabetes, personal history of gestational diabetes, preeclampsia or PCOS: yes Is patient over 74, have PCOS,  family history of premature CHD under age 7, diabetes, smoke, have hypertension or peripheral artery disease:  no At any time, has a partner hit, kicked or otherwise hurt or frightened you?: no Over the past 2 weeks, have you felt down, depressed or hopeless?: no Over the past 2 weeks, have you felt little interest or pleasure in doing things?:no   Gynecologic History No LMP recorded. Patient is not currently having periods (Reason: IUD). Contraception: IUD Last Pap: 12/15/15. Results were: normal Last mammogram: n/a.   Obstetric History OB History  No data available  G1P1  Past Medical History:  Diagnosis Date  . Anxiety 2010    History reviewed. No pertinent surgical history.   Current Outpatient Prescriptions:  .  sertraline (ZOLOFT) 50 MG tablet, Take 1 tablet (50 mg total) by mouth daily., Disp: 90 tablet, Rfl: 1 .  clonazePAM (KLONOPIN) 0.5 MG tablet, Take 1 tablet (0.5 mg total) by mouth 2 (two) times daily as needed for anxiety. (Patient not taking: Reported on 12/15/2016), Disp: 20 tablet, Rfl: 1 .  clotrimazole-betamethasone (LOTRISONE) cream, Apply 1 application topically 2 (two) times  daily. (Patient not taking: Reported on 12/15/2016), Disp: 30 g, Rfl: 0 .  minocycline (MINOCIN,DYNACIN) 50 MG capsule, Take 1 capsule (50 mg total) by mouth 2 (two) times daily. (Patient not taking: Reported on 10/10/2016), Disp: 60 capsule, Rfl: 0 .  predniSONE (DELTASONE) 20 MG tablet, Take 3 tablets (60 mg total) by mouth daily with breakfast. (Patient not taking: Reported on 12/15/2016), Disp: 21 tablet, Rfl: 0 .  valACYclovir (VALTREX) 1000 MG tablet, Take 1 tablet (1,000 mg total) by mouth 3 (three) times daily. (Patient not taking: Reported on 12/15/2016), Disp: 21 tablet, Rfl: 0 No Known Allergies  Social History  Substance Use Topics  . Smoking status: Never Smoker  . Smokeless tobacco: Never Used  . Alcohol use No    Family History  Problem Relation Age of Onset  . Hyperlipidemia Mother   . Hypertension Mother       Review of Systems  Constitutional: negative for fatigue and weight loss Respiratory: negative for cough and wheezing Cardiovascular: negative for chest pain, fatigue and palpitations Gastrointestinal: negative for abdominal pain and change in bowel habits Musculoskeletal:negative for myalgias Neurological: negative for gait problems and tremors Behavioral/Psych: negative for abusive relationship, depression Endocrine: negative for temperature intolerance    Genitourinary:negative for abnormal menstrual periods, genital lesions, hot flashes, sexual problems and vaginal discharge, + pelvic pain with sexual intercourse Integument/breast: negative for breast lump, breast tenderness, nipple discharge and skin lesion(s)    Objective:       BP 126/89   Pulse 90   Temp 97.7 F (  36.5 C) (Oral)   Wt 172 lb 12.8 oz (78.4 kg)   BMI 27.06 kg/m  General:   alert  Skin:   no rash or abnormalities, + upper lip hair, acne on forehead  Lungs:   clear to auscultation bilaterally  Heart:   regular rate and rhythm, S1, S2 normal, no murmur, click, rub or gallop  Breasts:    normal without suspicious masses, skin or nipple changes or axillary nodes  Abdomen:  normal findings: no organomegaly, soft, non-tender and no hernia  Pelvis:  External genitalia: normal general appearance Urinary system: urethral meatus normal and bladder without fullness, nontender Vaginal: normal without tenderness, induration or masses Cervix: normal appearance, IUD strings present Adnexa: normal bimanual exam Uterus: anteverted and non-tender, normal size   Lab Review Urine pregnancy test Labs reviewed yes Radiologic studies reviewed no  50% of 30 min visit spent on counseling and coordination of care.    Assessment:    Healthy female exam.    Well woman exam with routine gynecological exam - Plan: Prolactin, TSH, Testosterone, Free, Total, SHBG, 17-Hydroxyprogesterone, Progesterone, RPR, CBC with Differential/Platelet, Comprehensive metabolic panel  IUD check up - Plan: US Pelvis Complete, US Transvaginal Non-OB  Acne, unspecified acne type - Plan: Prolactin, TSH, Testosterone, Free, Total, SHBG, 17-Hydroxyprogesterone, Progesterone  H/O female hirsutism - Plan: Prolactin, TSH, Testosterone, Free, Total, SHBG, 17-Hydroxyprogesterone, Progesterone, RPR, CBC with Differential/Platelet, Comprehensive metabolic panel  Screening examination for STD (sexually transmitted disease) - Plan: Hepatitis C antibody, Hepatitis B surface antigen, HIV antibody  History of ovarian cyst - Plan: US Pelvis Complete, US Transvaginal Non-OB   Plan:    Education reviewed: calcium supplements, depression evaluation, low fat, low cholesterol diet, safe sex/STD prevention, self breast exams, skin cancer screening and weight bearing exercise. Contraception: IUD. Follow up in: 1 year.   No orders of the defined types were placed in this encounter.  Orders Placed This Encounter  Procedures  . US Pelvis Complete    Standing Status:   Future    Standing Expiration Date:   02/14/2018    Order  Specific Question:   Reason for Exam (SYMPTOM  OR DIAGNOSIS REQUIRED)    Answer:   IUD placement, r/o ovarian cyst    Order Specific Question:   Preferred imaging location?    Answer:   External    Comments:   [facility]  . US Transvaginal Non-OB    Standing Status:   Future    Standing Expiration Date:   02/14/2018    Order Specific Question:   Reason for Exam (SYMPTOM  OR DIAGNOSIS REQUIRED)    Answer:   IUD placement, r/o ovarian cyst    Order Specific Question:   Preferred imaging location?    Answer:   External    Comments:   [facility]  . Prolactin  . TSH  . Testosterone, Free, Total, SHBG  . 17-Hydroxyprogesterone  . Progesterone  . RPR  . Hepatitis C antibody  . Hepatitis B surface antigen  . HIV antibody  . CBC with Differential/Platelet  . Comprehensive metabolic panel    Possible management options include:   Dermatology consult Follow up as needed or in 1 year annual exam.

## 2016-12-15 NOTE — Progress Notes (Signed)
Patient is in the office for annual exam, has IUD in place for [facility], declines STD testing.

## 2016-12-19 LAB — CERVICOVAGINAL ANCILLARY ONLY
BACTERIAL VAGINITIS: NEGATIVE
CANDIDA VAGINITIS: NEGATIVE

## 2016-12-22 LAB — HEPATITIS C ANTIBODY

## 2016-12-22 LAB — CBC WITH DIFFERENTIAL/PLATELET
BASOS: 0 %
Basophils Absolute: 0 10*3/uL (ref 0.0–0.2)
EOS (ABSOLUTE): 0.3 10*3/uL (ref 0.0–0.4)
EOS: 5 %
HEMATOCRIT: 39.5 % (ref 34.0–46.6)
Hemoglobin: 13.5 g/dL (ref 11.1–15.9)
IMMATURE GRANULOCYTES: 0 %
Immature Grans (Abs): 0 10*3/uL (ref 0.0–0.1)
LYMPHS ABS: 2.1 10*3/uL (ref 0.7–3.1)
Lymphs: 33 %
MCH: 29.5 pg (ref 26.6–33.0)
MCHC: 34.2 g/dL (ref 31.5–35.7)
MCV: 86 fL (ref 79–97)
MONOS ABS: 0.4 10*3/uL (ref 0.1–0.9)
Monocytes: 7 %
NEUTROS ABS: 3.4 10*3/uL (ref 1.4–7.0)
Neutrophils: 55 %
Platelets: 250 10*3/uL (ref 150–379)
RBC: 4.58 x10E6/uL (ref 3.77–5.28)
RDW: 13.2 % (ref 12.3–15.4)
WBC: 6.3 10*3/uL (ref 3.4–10.8)

## 2016-12-22 LAB — COMPREHENSIVE METABOLIC PANEL
A/G RATIO: 1.7 (ref 1.2–2.2)
ALT: 24 IU/L (ref 0–32)
AST: 20 IU/L (ref 0–40)
Albumin: 4.5 g/dL (ref 3.5–5.5)
Alkaline Phosphatase: 58 IU/L (ref 39–117)
BUN/Creatinine Ratio: 18 (ref 9–23)
BUN: 13 mg/dL (ref 6–20)
Bilirubin Total: <0.2 mg/dL (ref 0.0–1.2)
CALCIUM: 9.6 mg/dL (ref 8.7–10.2)
CO2: 24 mmol/L (ref 18–29)
CREATININE: 0.74 mg/dL (ref 0.57–1.00)
Chloride: 99 mmol/L (ref 96–106)
GFR calc Af Amer: 127 mL/min/{1.73_m2} (ref >59)
GFR calc non Af Amer: 111 mL/min/{1.73_m2} (ref >59)
GLOBULIN, TOTAL: 2.7 g/dL (ref 1.5–4.5)
Glucose: 87 mg/dL (ref 65–99)
POTASSIUM: 4.6 mmol/L (ref 3.5–5.2)
Sodium: 141 mmol/L (ref 134–144)
Total Protein: 7.2 g/dL (ref 6.0–8.5)

## 2016-12-22 LAB — PROGESTERONE: PROGESTERONE: 0.2 ng/mL

## 2016-12-22 LAB — HEPATITIS B SURFACE ANTIGEN: Hepatitis B Surface Ag: NEGATIVE

## 2016-12-22 LAB — CYTOLOGY - PAP
DIAGNOSIS: UNDETERMINED — AB
HPV (WINDOPATH): NOT DETECTED

## 2016-12-22 LAB — PROLACTIN: Prolactin: 8.6 ng/mL (ref 4.8–23.3)

## 2016-12-22 LAB — TESTOSTERONE, FREE, TOTAL, SHBG
SEX HORMONE BINDING: 38.8 nmol/L (ref 24.6–122.0)
Testosterone, Free: 1.9 pg/mL (ref 0.0–4.2)
Testosterone: 22 ng/dL (ref 8–48)

## 2016-12-22 LAB — RPR: RPR: NONREACTIVE

## 2016-12-22 LAB — TSH: TSH: 3.43 u[IU]/mL (ref 0.450–4.500)

## 2016-12-22 LAB — HIV ANTIBODY (ROUTINE TESTING W REFLEX): HIV Screen 4th Generation wRfx: NONREACTIVE

## 2016-12-22 LAB — 17-HYDROXYPROGESTERONE: 17 HYDROXYPROGESTERONE: 49 ng/dL

## 2016-12-25 ENCOUNTER — Other Ambulatory Visit: Payer: Self-pay | Admitting: Certified Nurse Midwife

## 2016-12-28 ENCOUNTER — Ambulatory Visit (HOSPITAL_COMMUNITY): Payer: BLUE CROSS/BLUE SHIELD

## 2017-01-29 ENCOUNTER — Ambulatory Visit: Payer: BLUE CROSS/BLUE SHIELD | Admitting: Physician Assistant

## 2017-02-12 ENCOUNTER — Ambulatory Visit (INDEPENDENT_AMBULATORY_CARE_PROVIDER_SITE_OTHER): Payer: BLUE CROSS/BLUE SHIELD | Admitting: Physician Assistant

## 2017-02-12 ENCOUNTER — Encounter: Payer: Self-pay | Admitting: Physician Assistant

## 2017-02-12 DIAGNOSIS — F411 Generalized anxiety disorder: Secondary | ICD-10-CM

## 2017-02-12 DIAGNOSIS — F410 Panic disorder [episodic paroxysmal anxiety] without agoraphobia: Secondary | ICD-10-CM | POA: Diagnosis not present

## 2017-02-12 DIAGNOSIS — L309 Dermatitis, unspecified: Secondary | ICD-10-CM | POA: Diagnosis not present

## 2017-02-12 MED ORDER — SERTRALINE HCL 50 MG PO TABS: 50.0000 mg | ORAL_TABLET | Freq: Every day | ORAL | 2 refills | Status: DC

## 2017-02-12 MED ORDER — SERTRALINE HCL 50 MG PO TABS: 50.0000 mg | ORAL_TABLET | Freq: Every day | ORAL | 1 refills | Status: DC

## 2017-02-12 NOTE — Progress Notes (Signed)
Patient ID: Amanda Greer MRN: [id number], DOB: 1986/02/06, 31 y.o. Date of Encounter: 02/12/2017, 8:30 AM    Chief Complaint:  Chief Complaint  Patient presents with  . anxiety f/u     HPI: 31 y.o. year old female   She was here for CPE 07/17/2016----The following is from that OV note: when depression screen was performed she had a high score on this. I then noted this to patient and she started to become teary-eyed. Says that she has been having panic attacks over the last couple of months.  Says that the first one was in August. At that time she was in a restaurant with a live band. Her hands started getting sweaty, she felt like she was going to throw up. They had to leave. Says that sometimes she is just lying in bed and will suddenly have a panic attack -- feels like she cannot breathe. Says now she is afraid to go out to eat because is afraid it will happen again. Says that she has an 44-year-old daughter and needs to go do things with her but is afraid to go out of the house and afraid that she will have a panic attack somewhere and it will be embarrassing. Says that sometimes her mind starts racing and it will not stop and she feels anxious. Says that yesterday morning she was just cooking breakfast and felt high anxiety and could not control her mind. Says that everything with her life is good and she has nothing to be worried and anxious about. 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 Says they have bought a home and that is good. Says that she is not married because she chooses not to be-- but that she is with the same man for 11 years and that that is a monogamous relationship and they have a very strong good relationship. She has an 22-year-old daughter who is healthy and does very well in school and says that everything is good and she has nothing to be worried about. Says in the past she has been through things that are stressful--  buying a house etc.  things that should have caused anxiety didn't and now everything is totally stable she has no reason to be feeling this way. She says that when she is at work working as a Customer service manager she is okay because she is focused on her work and so her mind is staying busy and focused. However when she is at home she is constantly worried and afraid of having panic attack. Says that she has been reading about this and has started taking some B vitamins and has been doing aromatherapy and reads books to try to calm herself. However these measures are not controlling the panic and anxiety.  At that OV--I prescribed Zoloft 50mg  QD and also Klonopin to use PRN.   08/28/2016: Today she says that she has been feeling so much better since starting the Zoloft. Says that she wishes she had known to get on that medicine earlier. Says that she is feeling so much better. In general does not feel as anxious. Has not had anymore of the bad panic attacks since her last visit. Says she feels like she is even more patient with her daughter now and even her coworkers have noted her that she seems so much happier. Says that she hasn't needed any of the Klonopin. Says that she's a little scared to take that type medicine  but is glad to have it available to know that she has it if she were to need it.  Also is wanting something to treat acne that this on her left cheek that will not go away. Says that as a teenager she never had problems with acne never had much issues with her skin but now she has this area on her left cheek that is really bad it will not clear up. She has been using some topical medicines. Says that she washes her pillowcase every Sunday. Has not been drinking sodas etc. has done everything she can but is not clearing up.  02/12/2017: She is taking the Zoloft 50 mg daily. She states that this is working well. Does not feel that the dose needs to be increased or adjusted at all.  Has not had to use any of the Klonopin but  does like to know that it's available if she needed it. Reviewed that since last visit with me she had visit with Dr. Dennard Schaumann for Bell's palsy. Says that that was a very scary feeling -- it did completely resolve after about 5 weeks. Has no residual effects from that.  No other concerns to address today. She does ask if she can spread out these visits because of lack of insurance coverage.  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 Agreed to send in refills to last a year on her Zoloft. As well she says that she will be coming in for her annual physical before then. Her daughter is turning 8 soon--planning her birthday party! (Same age as my daughter!)   Home Meds:   Outpatient Medications Prior to Visit  Medication Sig Dispense Refill  . sertraline (ZOLOFT) 50 MG tablet Take 1 tablet (50 mg total) by mouth daily. 90 tablet 1  . clotrimazole-betamethasone (LOTRISONE) cream Apply 1 application topically 2 (two) times daily. (Patient not taking: Reported on 12/15/2016) 30 g 0   No facility-administered medications prior to visit.     Allergies: No Known Allergies    Review of Systems: See HPI for pertinent ROS. All other ROS negative.    Physical Exam: Blood pressure 124/78, pulse 81, temperature 98.6 F (37 C), temperature source Oral, resp. rate 16, weight 172 lb 12.8 oz (78.4 kg), SpO2 98 %., Body mass index is 27.06 kg/m. General: WNWD WF.  Appears in no acute distress. Neck: Supple. No thyromegaly. No lymphadenopathy. Lungs: Clear bilaterally to auscultation without wheezes, rales, or rhonchi. Breathing is unlabored. Heart: Regular rhythm. No murmurs, rubs, or gallops. Msk:  Strength and tone normal for age. Neuro: Alert and oriented X 3. Moves all extremities spontaneously. Gait is normal. CNII-XII grossly in tact. Psych:  Responds to questions appropriately with a normal affect.      ASSESSMENT AND PLAN:  31 y.o. year old female with  1. Panic disorder This is much improved and now stable/controlled. Will continue current dose of Zoloft 50 mg. Informed her that this does come in a higher dose so if in the future she ever feels that this is not well controlled to let me know and we could consider increasing the dose if needed. She has the Klonopin available to use if needed. - sertraline (ZOLOFT) 50 MG tablet; Take 1 tablet (50 mg total) by mouth daily.  Dispense: 90 tablet; Refill: 2  2. Generalized anxiety disorder This is much improved and now stable/controlled. Will continue  current dose of Zoloft 50 mg. Informed her that this does come in a higher dose so if in the future she ever feels that this is not well controlled to let me know and we could consider increasing the dose if needed. She has the Klonopin available to use if needed.  - sertraline (ZOLOFT) 50 MG tablet; Take 1 tablet (50 mg total) by mouth daily.  Dispense: 90 tablet; Refill: 2   Routine f/u office visit 12 months (will extend to 12 months secondary to poor insurance coverage at this time--02/12/2017 OV) or sooner if needed.  Signed, [address] [place], [place], [facility] 02/12/2017 8:30 AM

## 2017-12-06 ENCOUNTER — Other Ambulatory Visit: Payer: Self-pay

## 2017-12-06 DIAGNOSIS — F411 Generalized anxiety disorder: Secondary | ICD-10-CM

## 2017-12-06 DIAGNOSIS — F410 Panic disorder [episodic paroxysmal anxiety] without agoraphobia: Secondary | ICD-10-CM

## 2017-12-06 MED ORDER — SERTRALINE HCL 50 MG PO TABS: 50.0000 mg | ORAL_TABLET | Freq: Every day | ORAL | 2 refills | Status: DC

## 2018-02-11 ENCOUNTER — Ambulatory Visit: Payer: BLUE CROSS/BLUE SHIELD | Admitting: Physician Assistant

## 2018-03-06 ENCOUNTER — Ambulatory Visit: Payer: BLUE CROSS/BLUE SHIELD | Admitting: Physician Assistant

## 2018-03-13 ENCOUNTER — Ambulatory Visit: Payer: BLUE CROSS/BLUE SHIELD | Admitting: Physician Assistant

## 2018-03-14 ENCOUNTER — Other Ambulatory Visit: Payer: Self-pay

## 2018-03-14 ENCOUNTER — Encounter: Payer: Self-pay | Admitting: Physician Assistant

## 2018-03-14 ENCOUNTER — Ambulatory Visit (INDEPENDENT_AMBULATORY_CARE_PROVIDER_SITE_OTHER): Payer: BLUE CROSS/BLUE SHIELD | Admitting: Physician Assistant

## 2018-03-14 VITALS — BP 116/86 | HR 81 | Temp 98.1°F | Resp 14 | Ht 67.0 in | Wt 178.8 lb

## 2018-03-14 DIAGNOSIS — F411 Generalized anxiety disorder: Secondary | ICD-10-CM

## 2018-03-14 DIAGNOSIS — F410 Panic disorder [episodic paroxysmal anxiety] without agoraphobia: Secondary | ICD-10-CM

## 2018-03-14 MED ORDER — SERTRALINE HCL 50 MG PO TABS: 50.0000 mg | ORAL_TABLET | Freq: Every day | ORAL | 2 refills | Status: DC

## 2018-03-14 NOTE — Progress Notes (Signed)
Patient ID: Amanda Greer MRN: [id number], DOB: 07/21/1986, 32 y.o. Date of Encounter: 03/14/2018, 4:51 PM    Chief Complaint:  Chief Complaint  Patient presents with  . follow up with zoloft     HPI: 31 y.o. year old female   She was here for CPE 07/17/2016----The following is from that OV note: when depression screen was performed she had a high score on this. I then noted this to patient and she started to become teary-eyed. Says that she has been having panic attacks over the last couple of months.  Says that the first one was in August. At that time she was in a restaurant with a live band. Her hands started getting sweaty, she felt like she was going to throw up. They had to leave. Says that sometimes she is just lying in bed and will suddenly have a panic attack -- feels like she cannot breathe. Says now she is afraid to go out to eat because is afraid it will happen again. Says that she has an 66-year-old daughter and needs to go do things with her but is afraid to go out of the house and afraid that she will have a panic attack somewhere and it will be embarrassing. Says that sometimes her mind starts racing and it will not stop and she feels anxious. Says that yesterday morning she was just cooking breakfast and felt high anxiety and could not control her mind. Says that everything with her life is good and she has nothing to be worried and anxious about. 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 Says they have bought a home and that is good. Says that she is not married because she chooses not to be-- but that she is with the same man for 11 years and that that is a monogamous relationship and they have a very strong good relationship. She has an 59-year-old daughter who is healthy and does very well in school and says that everything is good and she has nothing to be worried about. Says in the past she has been through things that are stressful--  buying a  house etc. things that should have caused anxiety didn't and now everything is totally stable she has no reason to be feeling this way. She says that when she is at work working as a Customer service manager she is okay because she is focused on her work and so her mind is staying busy and focused. However when she is at home she is constantly worried and afraid of having panic attack. Says that she has been reading about this and has started taking some B vitamins and has been doing aromatherapy and reads books to try to calm herself. However these measures are not controlling the panic and anxiety.  At that OV--I prescribed Zoloft 50mg  QD and also Klonopin to use PRN.   08/28/2016: Today she says that she has been feeling so much better since starting the Zoloft. Says that she wishes she had known to get on that medicine earlier. Says that she is feeling so much better. In general does not feel as anxious. Has not had anymore of the bad panic attacks since her last visit. Says she feels like she is even more patient with her daughter now and even her coworkers have noted her that she seems so much happier. Says that she hasn't needed any of the Klonopin. Says that she's a little scared to take that  type medicine but is glad to have it available to know that she has it if she were to need it.  Also is wanting something to treat acne that this on her left cheek that will not go away. Says that as a teenager she never had problems with acne never had much issues with her skin but now she has this area on her left cheek that is really bad it will not clear up. She has been using some topical medicines. Says that she washes her pillowcase every Sunday. Has not been drinking sodas etc. has done everything she can but is not clearing up.  02/12/2017: She is taking the Zoloft 50 mg daily. She states that this is working well. Does not feel that the dose needs to be increased or adjusted at all.  Has not had to use any of the  Klonopin but does like to know that it's available if she needed it. Reviewed that since last visit with me she had visit with Dr. Dennard Schaumann for Bell's palsy. Says that that was a very scary feeling -- it did completely resolve after about 5 weeks. Has no residual effects from that.  No other concerns to address today. She does ask if she can spread out these visits because of lack of insurance coverage.  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 Agreed to send in refills to last a year on her Zoloft. As well she says that she will be coming in for her annual physical before then. Her daughter is turning 8 soon--planning her birthday party! (Same age as my daughter!)   03/14/2018: Reports that the Zoloft has continued to work very well.  States that with being on this she feels "completely normal and back to her normal self. Is that she still keeps the Klonopin in her purse as a safety net but has never had to take one. Says that she has had no feeling of anxiety or panic at all and that the Zoloft is completely controlling that. Says that she does not really like the idea of thinking that she is going to have to take a medicine for the rest of her life and is wondering what I think about whether she would be able to discontinue the medicine at some point. Also ask when her last CPE was.  It was 07/17/2016.  Is interested in scheduling another CPE to keep that up-to-date.  Also says that her insurance plan does fully cover visit for CPE but does not pay for these kinds of visits.  I told her to schedule her next visit as a CPE and that I can refill her Zoloft at visit even if it is considered a CPE.  Otherwise she is paying over $100 out of pocket for each of these visits. No other concerns to address today.   Home Meds:   Outpatient Medications Prior to Visit  Medication Sig Dispense Refill  .  sertraline (ZOLOFT) 50 MG tablet Take 1 tablet (50 mg total) by mouth daily. 90 tablet 2   No facility-administered medications prior to visit.     Allergies: No Known Allergies    Review of Systems: See HPI for pertinent ROS. All other ROS negative.    Physical Exam: Blood pressure 116/86, pulse 81, temperature 98.1 F (36.7 C), temperature source Oral, resp. rate 14, height 5\' 7"  (1.702 m), weight 81.1 kg (178 lb  12.8 oz), SpO2 98 %., Body mass index is 28 kg/m. General: WNWD WF. Appears in no acute distress. Neck: Supple. No thyromegaly. No lymphadenopathy. Lungs: Clear bilaterally to auscultation without wheezes, rales, or rhonchi. Breathing is unlabored. Heart: RRR with S1 S2. No murmurs, rubs, or gallops. Musculoskeletal:  Strength and tone normal for age. Extremities/Skin: Warm and dry.  Neuro: Alert and oriented X 3. Moves all extremities spontaneously. Gait is normal. CNII-XII grossly in tact. Psych:  Responds to questions appropriately with a normal affect.     ASSESSMENT AND PLAN:  32 y.o. year old female with   Generalized anxiety disorder 03/14/2018: This is very well controlled.  Continue Zoloft 50 mg daily. Discussed my concern that if she stops the medication that her symptoms will recur.  Discussed the fact that she was having no increased stressors going on at the time that she was having panic and anxiety so I am concerned that this is just the way her body is at this point.  Recommend staying on it for at least another 6 months and then could try coming off medication and see if symptoms recur or not. - sertraline (ZOLOFT) 50 MG tablet; Take 1 tablet (50 mg total) by mouth daily.  Dispense: 90 tablet; Refill: 2   Routine f/u office visit 12 months . Also will start scheduling visits annually as CPE as CPEs are covered by her insurance with no out of pocket cost to her.  [address] [place], [place], [facility] 03/14/2018 4:51 PM

## 2018-07-05 ENCOUNTER — Encounter: Payer: Self-pay | Admitting: Family Medicine

## 2018-07-05 ENCOUNTER — Ambulatory Visit (INDEPENDENT_AMBULATORY_CARE_PROVIDER_SITE_OTHER): Payer: BLUE CROSS/BLUE SHIELD | Admitting: Family Medicine

## 2018-07-05 VITALS — BP 110/74 | HR 108 | Temp 100.3°F | Resp 15 | Ht 67.0 in | Wt 184.0 lb

## 2018-07-05 DIAGNOSIS — R509 Fever, unspecified: Secondary | ICD-10-CM

## 2018-07-05 DIAGNOSIS — J189 Pneumonia, unspecified organism: Secondary | ICD-10-CM | POA: Diagnosis not present

## 2018-07-05 LAB — INFLUENZA A AND B AG, IMMUNOASSAY
INFLUENZA A ANTIGEN: NOT DETECTED
INFLUENZA B ANTIGEN: NOT DETECTED

## 2018-07-05 MED ORDER — AZITHROMYCIN 250 MG PO TABS: ORAL_TABLET | ORAL | 0 refills | Status: DC

## 2018-07-05 NOTE — Progress Notes (Signed)
Subjective:    Patient ID: Amanda Greer, female    DOB: 03-03-1986, 32 y.o.   MRN: [id number]  HPI Patient is has been sick for approximately 1 week.  She reports fevers as high as 103.  She reports left-sided pleurisy, worsening cough productive of green sputum, diffuse body aches and chills.  She denies any rhinorrhea.  She denies any sinus pain.  She denies any otalgia.  She does report a dull headache and neck is.  Flu test today is negative.  She denies any sick contacts.  She denies any hemoptysis.  She denies any nausea vomiting diarrhea.  She denies any rash.  She denies any dysuria Past Medical History:  Diagnosis Date  . Anxiety 2010   No past surgical history on file. Current Outpatient Medications on File Prior to Visit  Medication Sig Dispense Refill  . sertraline (ZOLOFT) 50 MG tablet Take 1 tablet (50 mg total) by mouth daily. 90 tablet 2   No current facility-administered medications on file prior to visit.    No Known Allergies Social History   Socioeconomic History  . Marital status: Single    Spouse name: Not on file  . Number of children: Not on file  . Years of education: Not on file  . Highest education level: Not on file  Occupational History  . Not on file  Social Needs  . Financial resource strain: Not on file  . Food insecurity:    Worry: Not on file    Inability: Not on file  . Transportation needs:    Medical: Not on file    Non-medical: Not on file  Tobacco Use  . Smoking status: Never Smoker  . Smokeless tobacco: Never Used  Substance and Sexual Activity  . Alcohol use: No  . Drug use: No  . Sexual activity: Yes    Birth control/protection: IUD  Lifestyle  . Physical activity:    Days per week: Not on file    Minutes per session: Not on file  . Stress: Not on file  Relationships  . Social connections:    Talks on phone: Not on file    Gets together: Not on file    Attends religious service: Not on file    Active member of club  or organization: Not on file    Attends meetings of clubs or organizations: Not on file    Relationship status: Not on file  . Intimate partner violence:    Fear of current or ex partner: Not on file    Emotionally abused: Not on file    Physically abused: Not on file    Forced sexual activity: Not on file  Other Topics Concern  . Not on file  Social History Narrative   Entered 05/2014:    At home, It is patient's daughter, and daughter's father. It is the 3 of them at home.   Patient currently working at a bank.      Review of Systems  All other systems reviewed and are negative.      Objective:   Physical Exam  HENT:  Right Ear: External ear normal.  Left Ear: External ear normal.  Nose: Nose normal.  Mouth/Throat: Oropharynx is clear and moist. No oropharyngeal exudate.  Eyes: Conjunctivae are normal.  Neck: Normal range of motion. Neck supple.  Cardiovascular: Normal rate, regular rhythm and normal heart sounds.  Pulmonary/Chest: Effort normal. No stridor. She has no wheezes. She has rhonchi in the right upper field.  She has no rales.      Abdominal: Soft. Bowel sounds are normal. She exhibits no distension and no mass. There is no tenderness. There is no rebound and no guarding.  Lymphadenopathy:    She has no cervical adenopathy.  Vitals reviewed.         Assessment & Plan:  Fever, unspecified fever cause - Plan: azithromycin (ZITHROMAX) 250 MG tablet, Influenza A and B Ag, Immunoassay  Walking pneumonia  Lu test is negative.  I am concerned about a bacterial pneumonia/walking pneumonia from atypical bacterial pathogens.  I will start the patient on Z-Pak 500 mg p.o. daily 1, 250 mg p.o. q. days 2 through 5.  Recheck on Monday or seek medical attention immediately if worsening.  If no better by Monday, proceed with imaging of the chest.

## 2018-10-08 ENCOUNTER — Encounter: Payer: Self-pay | Admitting: Family Medicine

## 2018-10-08 ENCOUNTER — Ambulatory Visit (INDEPENDENT_AMBULATORY_CARE_PROVIDER_SITE_OTHER): Payer: BLUE CROSS/BLUE SHIELD | Admitting: Family Medicine

## 2018-10-08 ENCOUNTER — Ambulatory Visit
Admission: RE | Admit: 2018-10-08 | Discharge: 2018-10-08 | Disposition: A | Discharge status: Home / Self Care | Payer: BLUE CROSS/BLUE SHIELD | Source: Ambulatory Visit | Attending: Family Medicine | Admitting: Family Medicine

## 2018-10-08 VITALS — BP 110/80 | HR 82 | Temp 98.2°F | Resp 16 | Ht 67.0 in | Wt 173.2 lb

## 2018-10-08 DIAGNOSIS — R0789 Other chest pain: Secondary | ICD-10-CM

## 2018-10-08 DIAGNOSIS — R1012 Left upper quadrant pain: Secondary | ICD-10-CM

## 2018-10-08 DIAGNOSIS — J40 Bronchitis, not specified as acute or chronic: Secondary | ICD-10-CM | POA: Diagnosis not present

## 2018-10-08 MED ORDER — ALBUTEROL SULFATE HFA 108 (90 BASE) MCG/ACT IN AERS: 2.0000 | INHALATION_SPRAY | RESPIRATORY_TRACT | 0 refills | Status: DC | PRN

## 2018-10-08 MED ORDER — PREDNISONE 20 MG PO TABS: ORAL_TABLET | ORAL | 0 refills | Status: DC

## 2018-10-08 NOTE — Progress Notes (Signed)
Patient ID: Amanda Greer, female    DOB: May 19, 1986, 33 y.o.   MRN: [id number]  PCP: Susy Frizzle, MD  Chief Complaint  Patient presents with  . Cough    Patient has c/o cough with rib pain. Patient states she has had cough since having Pneumonia in October.     Subjective:   Amanda Greer is a 33 y.o. female, presents to clinic with CC of left lower rib pain and LUQ abdominal pain, is sharp and intermittent, has gradually worsened associated with non-productive cough x 2 months after being dx with and treated fpr walking pneumonia by her pcp.  Has become more severe and more constant it reproducible with movement breathing coughing and palpation is located to her left upper abdomen and left lower ribs.  No worsening of pain with inspiration.  She is noticed no abnormalities skin in that area.  She never felt any popping or clicking when she was coughing more severely 2 months ago.  She is concerned that her cough never completely resolved, and now she tends to have more dry cough that feels slightly tight and wheezy in her upper central chest.  The onset of her illness she did have fever and she believes she has had low-grade fever last couple days.  She reports that her daughter actually did have mono just prior to her becoming ill in October.   Patient denies any other chest pain, orthopnea, PND, unintentional weight loss, rash, sore throat, headache   Patient Active Problem List   Diagnosis Date Noted  . Generalized anxiety disorder 02/12/2017  . Encounter for IUD removal and reinsertion 11/16/2015     Prior to Admission medications   Medication Sig Start Date End Date Taking? Authorizing Provider  sertraline (ZOLOFT) 50 MG tablet Take 1 tablet (50 mg total) by mouth daily. 03/14/18  Yes Dena Billet B, PA-C     No Known Allergies   Family History  Problem Relation Age of Onset  . Hyperlipidemia Mother   . Hypertension Mother      Social History    Socioeconomic History  . Marital status: Single    Spouse name: Not on file  . Number of children: Not on file  . Years of education: Not on file  . Highest education level: Not on file  Occupational History  . Not on file  Social Needs  . Financial resource strain: Not on file  . Food insecurity:    Worry: Not on file    Inability: Not on file  . Transportation needs:    Medical: Not on file    Non-medical: Not on file  Tobacco Use  . Smoking status: Never Smoker  . Smokeless tobacco: Never Used  Substance and Sexual Activity  . Alcohol use: No  . Drug use: No  . Sexual activity: Yes    Birth control/protection: I.U.D.  Lifestyle  . Physical activity:    Days per week: Not on file    Minutes per session: Not on file  . Stress: Not on file  Relationships  . Social connections:    Talks on phone: Not on file    Gets together: Not on file    Attends religious service: Not on file    Active member of club or organization: Not on file    Attends meetings of clubs or organizations: Not on file    Relationship status: Not on file  . Intimate partner violence:    Fear of  current or ex partner: Not on file    Emotionally abused: Not on file    Physically abused: Not on file    Forced sexual activity: Not on file  Other Topics Concern  . Not on file  Social History Narrative   Entered 05/2014:    At home, It is patient's daughter, and daughter's father. It is the 3 of them at home.   Patient currently working at a bank.     Review of Systems  Constitutional: Negative.   HENT: Negative.   Eyes: Negative.   Respiratory: Positive for cough, chest tightness and wheezing. Negative for apnea and choking.   Cardiovascular: Positive for chest pain. Negative for palpitations and leg swelling.  Gastrointestinal: Negative.   Endocrine: Negative.   Genitourinary: Negative.   Musculoskeletal: Negative.   Skin: Negative.   Allergic/Immunologic: Negative.   Neurological:  Negative.   Hematological: Negative.   Psychiatric/Behavioral: Negative.   All other systems reviewed and are negative.      Objective:    Vitals:   10/08/18 0837  BP: 110/80  Pulse: 82  Resp: 16  Temp: 98.2 F (36.8 C)  TempSrc: Oral  SpO2: 98%  Weight: 173 lb 4 oz (78.6 kg)  Height: 5\' 7"  (1.702 m)      Physical Exam Vitals signs and nursing note reviewed.  Constitutional:      General: She is not in acute distress.    Appearance: Normal appearance. She is well-developed. She is not ill-appearing, toxic-appearing or diaphoretic.  HENT:     Head: Normocephalic and atraumatic.     Right Ear: External ear normal.     Left Ear: External ear normal.     Nose: Nose normal. No congestion or rhinorrhea.     Mouth/Throat:     Mouth: Mucous membranes are moist.     Pharynx: Oropharynx is clear. No posterior oropharyngeal erythema.  Eyes:     General:        Right eye: No discharge.        Left eye: No discharge.     Conjunctiva/sclera: Conjunctivae normal.     Pupils: Pupils are equal, round, and reactive to light.  Neck:     Musculoskeletal: Normal range of motion and neck supple.     Trachea: No tracheal deviation.  Cardiovascular:     Rate and Rhythm: Normal rate and regular rhythm.     Pulses: Normal pulses.     Heart sounds: Normal heart sounds. No murmur. No friction rub. No gallop.   Pulmonary:     Effort: Pulmonary effort is normal. No tachypnea, accessory muscle usage or respiratory distress.     Breath sounds: No stridor, decreased air movement or transmitted upper airway sounds. Wheezing present. No decreased breath sounds, rhonchi or rales.     Comments: Frequent cough, pt grabs her left lower ribs Chest:     Chest wall: Tenderness present. No mass, deformity, swelling, crepitus or edema. There is no dullness to percussion.    Abdominal:     General: Abdomen is flat. Bowel sounds are normal. There is no distension.     Palpations: Abdomen is soft. There  is no splenomegaly or mass.     Tenderness: There is abdominal tenderness in the left upper quadrant. There is no right CVA tenderness, left CVA tenderness, guarding or rebound. Negative signs include Murphy's sign.     Hernia: No hernia is present.  Musculoskeletal: Normal range of motion.  Lymphadenopathy:  Cervical: No cervical adenopathy.  Skin:    General: Skin is warm and dry.     Capillary Refill: Capillary refill takes less than 2 seconds.     Findings: No rash.  Neurological:     Mental Status: She is alert.     Motor: No weakness or abnormal muscle tone.     Coordination: Coordination normal.  Psychiatric:        Mood and Affect: Mood normal.        Behavior: Behavior normal.           Assessment & Plan:      ICD-10-CM   1. Left-sided chest wall pain R07.89 DG Chest 2 View    DG Abd 1 View  2. LUQ abdominal pain R10.12 DG Chest 2 View    DG Abd 1 View  3. Bronchitis J40     Patient with community-acquired pneumonia about 2 months ago who presents with continued cough that feels more tight and wheezy also has left lower anterior rib and left upper quadrant abdominal pain used to be intermittent now more constant and severe, is reproducible on exam with palpation and evidently hurts when she coughs otherwise she has no pain with inspiration, she does have slight expiratory wheeze on exam and suspect that her prolonged cough with chest tightness and wheezes bronchitis which we will treat with inhaler and steroids.  Do not suspect rib fractures but possibly some costochondritis for her left lower rib pain unsure if the left upper quadrant abdominal pain is abdominal wall pain or something else she is very tender in that area but has no other symptoms like vomiting nausea change in appetite diarrhea.    Differential diagnosis for patient's pain be chest wall pain, costochondritis- the steroids may help a little bit.  Patient briefly mentions that her daughter did have mono,  it be highly unlikely but patient was encouraged to follow-up if she has any prolonged symptoms with fever nausea change in appetite with continued left upper quadrant abdominal pain but possibly want to do some testing for mono or abdominal ultrasound.   Delsa Grana, PA-C 10/08/18 9:08 AM

## 2018-12-13 ENCOUNTER — Other Ambulatory Visit: Payer: Self-pay | Admitting: Family Medicine

## 2018-12-13 DIAGNOSIS — F411 Generalized anxiety disorder: Secondary | ICD-10-CM

## 2018-12-13 DIAGNOSIS — F410 Panic disorder [episodic paroxysmal anxiety] without agoraphobia: Secondary | ICD-10-CM

## 2018-12-13 MED ORDER — SERTRALINE HCL 50 MG PO TABS: 50.0000 mg | ORAL_TABLET | Freq: Every day | ORAL | 2 refills | Status: DC

## 2019-03-17 ENCOUNTER — Other Ambulatory Visit: Payer: Self-pay

## 2019-03-17 ENCOUNTER — Encounter: Payer: BLUE CROSS/BLUE SHIELD | Admitting: Physician Assistant

## 2019-03-17 ENCOUNTER — Encounter: Payer: Self-pay | Admitting: Family Medicine

## 2019-03-17 ENCOUNTER — Ambulatory Visit (INDEPENDENT_AMBULATORY_CARE_PROVIDER_SITE_OTHER): Payer: BC Managed Care – PPO | Admitting: Family Medicine

## 2019-03-17 VITALS — BP 108/70 | HR 90 | Temp 98.5°F | Resp 18 | Ht 67.0 in | Wt 174.0 lb

## 2019-03-17 DIAGNOSIS — F411 Generalized anxiety disorder: Secondary | ICD-10-CM | POA: Diagnosis not present

## 2019-03-17 DIAGNOSIS — Z0001 Encounter for general adult medical examination with abnormal findings: Secondary | ICD-10-CM

## 2019-03-17 DIAGNOSIS — Z0000 Encounter for general adult medical examination without abnormal findings: Secondary | ICD-10-CM

## 2019-03-17 DIAGNOSIS — F410 Panic disorder [episodic paroxysmal anxiety] without agoraphobia: Secondary | ICD-10-CM | POA: Diagnosis not present

## 2019-03-17 MED ORDER — SERTRALINE HCL 50 MG PO TABS: 50.0000 mg | ORAL_TABLET | Freq: Every day | ORAL | 3 refills | Status: DC

## 2019-03-17 NOTE — Progress Notes (Signed)
Subjective:    Patient ID: Amanda Greer, female    DOB: [birth year], 31 y.o.   MRN: [id number]  HPI Patient is a very pleasant 33 year old Caucasian female here today for complete physical exam.  Her last Pap smear was in 2018 and was normal.  She sees her gynecologist later this year for her breast exam and Pap smear.  She has no family history of breast cancer.  She denies any breast lumps on self exam.  She currently has an IUD.  With her IUD she does not have any menstrual..  Therefore she denies any menorrhagia.  Overall she is doing well.  She is not taking any calcium or vitamin D.  She still takes Zoloft 50 mg daily for anxiety.  She states that when she started the medication she was "a basket case".  She was struggling with anxiety and having panic attacks.  Since starting the medication she states that she is doing extremely well.  She has no desire to stop the medication.  She is not sure if she even still needs the medication or if it is a placebo effect however she is hesitant to 1 to make any changes to it since is doing so well and she denies any side effects.  Her immunizations are up-to-date aside from a tetanus shot but she politely declines a tetanus shot today. Past Medical History:  Diagnosis Date  . Anxiety 2010   No past surgical history on file.  Current Outpatient Medications on File Prior to Visit  Medication Sig Dispense Refill  . albuterol (PROVENTIL HFA;VENTOLIN HFA) 108 (90 Base) MCG/ACT inhaler Inhale 2 puffs into the lungs every 4 (four) hours as needed for wheezing or shortness of breath. 1 Inhaler 0  . predniSONE (DELTASONE) 20 MG tablet 2 tabs poqday 1-3, 1 tabs poqday 4-6 9 tablet 0  . sertraline (ZOLOFT) 50 MG tablet Take 1 tablet (50 mg total) by mouth daily. 90 tablet 2   No current facility-administered medications on file prior to visit.    No Known Allergies Social History   Socioeconomic History  . Marital status: Single    Spouse name: Not on  file  . Number of children: Not on file  . Years of education: Not on file  . Highest education level: Not on file  Occupational History  . Not on file  Social Needs  . Financial resource strain: Not on file  . Food insecurity    Worry: Not on file    Inability: Not on file  . Transportation needs    Medical: Not on file    Non-medical: Not on file  Tobacco Use  . Smoking status: Never Smoker  . Smokeless tobacco: Never Used  Substance and Sexual Activity  . Alcohol use: No  . Drug use: No  . Sexual activity: Yes    Birth control/protection: I.U.D.  Lifestyle  . Physical activity    Days per week: Not on file    Minutes per session: Not on file  . Stress: Not on file  Relationships  . Social Herbalist on phone: Not on file    Gets together: Not on file    Attends religious service: Not on file    Active member of club or organization: Not on file    Attends meetings of clubs or organizations: Not on file    Relationship status: Not on file  . Intimate partner violence    Fear of current or  ex partner: Not on file    Emotionally abused: Not on file    Physically abused: Not on file    Forced sexual activity: Not on file  Other Topics Concern  . Not on file  Social History Narrative   Entered 05/2014:    At home, It is patient's daughter, and daughter's father. It is the 3 of them at home.   Patient currently working at a bank.   Family History  Problem Relation Age of Onset  . Hyperlipidemia Mother   . Hypertension Mother       Review of Systems  All other systems reviewed and are negative.      Objective:   Physical Exam Vitals signs reviewed.  Constitutional:      General: She is not in acute distress.    Appearance: Normal appearance. She is normal weight. She is not ill-appearing or toxic-appearing.  HENT:     Head: Normocephalic and atraumatic.     Right Ear: Tympanic membrane and ear canal normal.     Left Ear: Tympanic membrane and  ear canal normal.     Nose: Nose normal. No congestion or rhinorrhea.     Mouth/Throat:     Mouth: Mucous membranes are moist.     Pharynx: Oropharynx is clear. No oropharyngeal exudate or posterior oropharyngeal erythema.  Eyes:     General: No scleral icterus.       Right eye: No discharge.        Left eye: No discharge.     Extraocular Movements: Extraocular movements intact.     Conjunctiva/sclera: Conjunctivae normal.     Pupils: Pupils are equal, round, and reactive to light.  Neck:     Musculoskeletal: Neck supple.     Vascular: No carotid bruit.  Cardiovascular:     Rate and Rhythm: Normal rate and regular rhythm.     Pulses: Normal pulses.     Heart sounds: Normal heart sounds. No murmur. No friction rub. No gallop.   Pulmonary:     Effort: Pulmonary effort is normal. No respiratory distress.     Breath sounds: Normal breath sounds. No stridor. No wheezing, rhonchi or rales.  Chest:     Chest wall: No tenderness.  Abdominal:     General: Bowel sounds are normal. There is no distension.     Palpations: Abdomen is soft. There is no mass.     Tenderness: There is no abdominal tenderness. There is no guarding or rebound.     Hernia: No hernia is present.  Musculoskeletal:     Right lower leg: No edema.     Left lower leg: No edema.  Lymphadenopathy:     Cervical: No cervical adenopathy.  Skin:    Coloration: Skin is not jaundiced.     Findings: No erythema, lesion or rash.  Neurological:     General: No focal deficit present.     Mental Status: She is alert and oriented to person, place, and time. Mental status is at baseline.     Cranial Nerves: No cranial nerve deficit.     Sensory: No sensory deficit.     Motor: No weakness.     Coordination: Coordination normal.     Gait: Gait normal.     Deep Tendon Reflexes: Reflexes normal.  Psychiatric:        Mood and Affect: Mood normal.        Behavior: Behavior normal.        Thought Content: Thought  content normal.         Judgment: Judgment normal.           Assessment & Plan:  1. General medical exam Patient's physical exam today is completely normal.  I will check a CBC, CMP, and fasting lipid panel.  Otherwise her review of systems is negative.  Therefore no other lab work is indicated.  I recommended a tetanus shot but she politely declined.  I will defer her Pap smear and her breast exam to her gynecologist. - CBC with Differential/Platelet - COMPLETE METABOLIC PANEL WITH GFR - Lipid panel  2. Panic disorder - sertraline (ZOLOFT) 50 MG tablet; Take 1 tablet (50 mg total) by mouth daily.  Dispense: 90 tablet; Refill: 3  3. Generalized anxiety disorder Patient is doing extremely well on Zoloft.  She has no desire to change the medication or wean off the medication at this time.  She requests a 90-day supply with 3 refills and I will be happy to oblige.  She does have a prescription for Klonopin that several years old.  She never used the medication.  She is going to throw it away.  I told her that I would be happy to refill this if she needed a refill in the future given that she uses it extremely sparingly for panic attacks.  As a note also recommended that she take 1200 mg a day of calcium and 1000 units a day of vitamin D to help prevent osteoporosis later in life - sertraline (ZOLOFT) 50 MG tablet; Take 1 tablet (50 mg total) by mouth daily.  Dispense: 90 tablet; Refill: 3

## 2019-03-18 LAB — CBC WITH DIFFERENTIAL/PLATELET
Absolute Monocytes: 390 cells/uL (ref 200–950)
Basophils Absolute: 30 cells/uL (ref 0–200)
Basophils Relative: 0.5 %
Eosinophils Absolute: 300 cells/uL (ref 15–500)
Eosinophils Relative: 5 %
HCT: 41.6 % (ref 35.0–45.0)
Hemoglobin: 14 g/dL (ref 11.7–15.5)
Lymphs Abs: 1722 cells/uL (ref 850–3900)
MCH: 29.4 pg (ref 27.0–33.0)
MCHC: 33.7 g/dL (ref 32.0–36.0)
MCV: 87.2 fL (ref 80.0–100.0)
MPV: 11.5 fL (ref 7.5–12.5)
Monocytes Relative: 6.5 %
Neutro Abs: 3558 cells/uL (ref 1500–7800)
Neutrophils Relative %: 59.3 %
Platelets: 267 10*3/uL (ref 140–400)
RBC: 4.77 10*6/uL (ref 3.80–5.10)
RDW: 12.5 % (ref 11.0–15.0)
Total Lymphocyte: 28.7 %
WBC: 6 10*3/uL (ref 3.8–10.8)

## 2019-03-18 LAB — COMPLETE METABOLIC PANEL WITH GFR
AG Ratio: 1.8 (calc) (ref 1.0–2.5)
ALT: 12 U/L (ref 6–29)
AST: 16 U/L (ref 10–30)
Albumin: 4.5 g/dL (ref 3.6–5.1)
Alkaline phosphatase (APISO): 53 U/L (ref 31–125)
BUN: 14 mg/dL (ref 7–25)
CO2: 25 mmol/L (ref 20–32)
Calcium: 9.2 mg/dL (ref 8.6–10.2)
Chloride: 103 mmol/L (ref 98–110)
Creat: 0.86 mg/dL (ref 0.50–1.10)
GFR, Est African American: 104 mL/min/{1.73_m2} (ref >=60)
GFR, Est Non African American: 90 mL/min/{1.73_m2} (ref >=60)
Globulin: 2.5 g/dL (calc) (ref 1.9–3.7)
Glucose, Bld: 95 mg/dL (ref 65–99)
Potassium: 4.3 mmol/L (ref 3.5–5.3)
Sodium: 138 mmol/L (ref 135–146)
Total Bilirubin: 0.6 mg/dL (ref 0.2–1.2)
Total Protein: 7 g/dL (ref 6.1–8.1)

## 2019-03-18 LAB — LIPID PANEL
Cholesterol: 196 mg/dL (ref <200)
HDL: 49 mg/dL — ABNORMAL LOW (ref >=50)
LDL Cholesterol (Calc): 122 mg/dL (calc) — ABNORMAL HIGH
Non-HDL Cholesterol (Calc): 147 mg/dL (calc) — ABNORMAL HIGH (ref <130)
Total CHOL/HDL Ratio: 4 (calc) (ref <5.0)
Triglycerides: 141 mg/dL (ref <150)

## 2019-04-08 ENCOUNTER — Encounter: Payer: Self-pay | Admitting: Obstetrics & Gynecology

## 2019-04-08 ENCOUNTER — Ambulatory Visit (INDEPENDENT_AMBULATORY_CARE_PROVIDER_SITE_OTHER): Payer: BC Managed Care – PPO | Admitting: Obstetrics & Gynecology

## 2019-04-08 ENCOUNTER — Other Ambulatory Visit: Payer: Self-pay

## 2019-04-08 VITALS — BP 123/86 | HR 90 | Ht 67.0 in | Wt 175.4 lb

## 2019-04-08 DIAGNOSIS — Z01419 Encounter for gynecological examination (general) (routine) without abnormal findings: Secondary | ICD-10-CM

## 2019-04-08 DIAGNOSIS — Z124 Encounter for screening for malignant neoplasm of cervix: Secondary | ICD-10-CM | POA: Diagnosis not present

## 2019-04-08 DIAGNOSIS — Z1151 Encounter for screening for human papillomavirus (HPV): Secondary | ICD-10-CM | POA: Diagnosis not present

## 2019-04-08 DIAGNOSIS — Z113 Encounter for screening for infections with a predominantly sexual mode of transmission: Secondary | ICD-10-CM | POA: Diagnosis not present

## 2019-04-08 DIAGNOSIS — N898 Other specified noninflammatory disorders of vagina: Secondary | ICD-10-CM | POA: Diagnosis not present

## 2019-04-08 NOTE — Progress Notes (Signed)
Patient is in the office for annual, last pap 12-15-16. Pt currently has IUD, inserted 11-16-15. Pt desires std testing today. GAD-7 score =6

## 2019-04-08 NOTE — Progress Notes (Signed)
Subjective:    Amanda Greer is a 33 y.o. single P10 (21 yo daughter)  female who presents for an annual exam. The patient has no complaints today. She would like STI testing (partner has not been faithful)  The patient is sexually active. GYN screening history: last pap: was normal. The patient wears seatbelts: yes. The patient participates in regular exercise: no. Has the patient ever been transfused or tattooed?: yes. The patient reports that there is not domestic violence in her life.   Menstrual History: OB History   No obstetric history on file.     Menarche age: 28 No LMP recorded. (Menstrual status: IUD).    The following portions of the patient's history were reviewed and updated as appropriate: allergies, current medications, past family history, past medical history, past social history, past surgical history and problem list.  Review of Systems Pertinent items are noted in HPI.   FH- no breast/gyn/colon cancer, + CVD She works as a Customer service manager at [employer] She has an IUD, placed 2017, second IUD She has been with same partner for 13 years She has a FP at [place]   Objective:    BP 123/86   Pulse 90   Ht 5\' 7"  (1.702 m)   Wt 175 lb 6.4 oz (79.6 kg)   BMI 27.47 kg/m   General Appearance:    Alert, cooperative, no distress, appears stated age  Head:    Normocephalic, without obvious abnormality, atraumatic  Eyes:    PERRL, conjunctiva/corneas clear, EOM's intact, fundi    benign, both eyes  Ears:    Normal TM's and external ear canals, both ears  Nose:   Nares normal, septum midline, mucosa normal, no drainage    or sinus tenderness  Throat:   Lips, mucosa, and tongue normal; teeth and gums normal  Neck:   Supple, symmetrical, trachea midline, no adenopathy;    thyroid:  no enlargement/tenderness/nodules; no carotid   bruit or JVD  Back:     Symmetric, no curvature, ROM normal, no CVA tenderness  Lungs:     Clear to auscultation bilaterally,  respirations unlabored  Chest Wall:    No tenderness or deformity   Heart:    Regular rate and rhythm, S1 and S2 normal, no murmur, rub   or gallop  Breast Exam:    No tenderness, masses, or nipple abnormality  Abdomen:     Soft, non-tender, bowel sounds active all four quadrants,    no masses, no organomegaly  Genitalia:    Normal female without lesion, discharge or tenderness, normal size and shape, retroverted, mobile, non-tender, normal adnexal exam      Extremities:   Extremities normal, atraumatic, no cyanosis or edema  Pulses:   2+ and symmetric all extremities  Skin:   Skin color, texture, turgor normal, no rashes or lesions  Lymph nodes:   Cervical, supraclavicular, and axillary nodes normal  Neurologic:   CNII-XII intact, normal strength, sensation and reflexes    throughout  .    Assessment:    Healthy female exam.    Plan:     Thin prep Pap smear. with cotesting STI testing per patient request

## 2019-04-09 LAB — CERVICOVAGINAL ANCILLARY ONLY
Bacterial vaginitis: NEGATIVE
Candida vaginitis: NEGATIVE
Chlamydia: NEGATIVE
Neisseria Gonorrhea: NEGATIVE
Trichomonas: NEGATIVE

## 2019-04-10 LAB — HIV ANTIBODY (ROUTINE TESTING W REFLEX): HIV Screen 4th Generation wRfx: NONREACTIVE

## 2019-04-10 LAB — HEPATITIS B SURFACE ANTIGEN: Hepatitis B Surface Ag: NEGATIVE

## 2019-04-10 LAB — HEPATITIS C ANTIBODY: Hep C Virus Ab: 0.1 s/co ratio (ref 0.0–0.9)

## 2019-04-10 LAB — CYTOLOGY - PAP
Diagnosis: NEGATIVE
HPV: NOT DETECTED

## 2019-04-10 LAB — RPR: RPR Ser Ql: NONREACTIVE

## 2019-07-03 IMAGING — CR DG ABDOMEN 1V
1 series · 1 of 1 positions shown · non-contrast
Comparison: None.

CLINICAL DATA: Left upper quadrant pain for 3 weeks

EXAM:
ABDOMEN - 1 VIEW

[w abdomen upright]
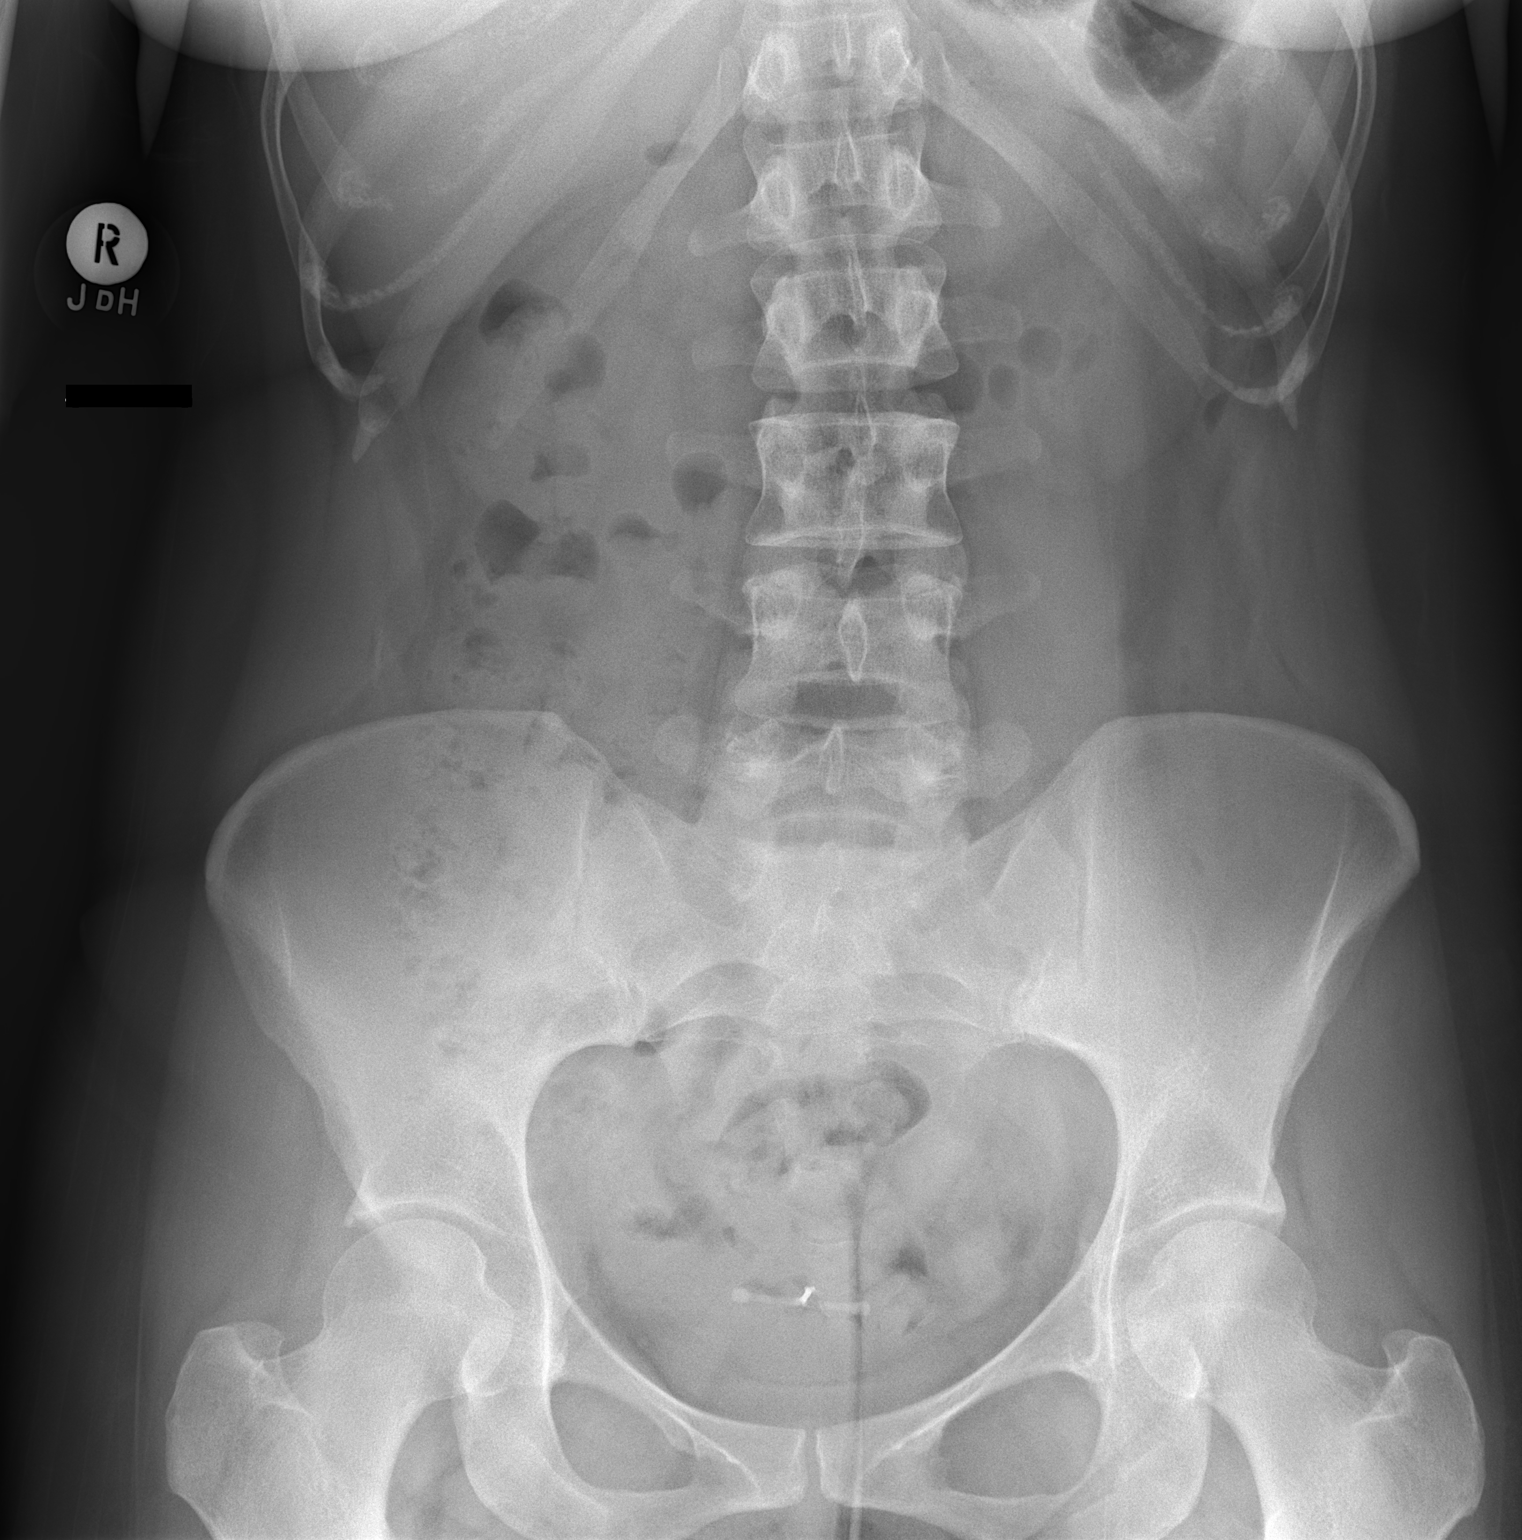

[1 of 1 positions shown; findings below may reference images not displayed]

FINDINGS: A supine film of the abdomen shows no bowel obstruction. Only a
minimal amount of feces is present primarily in the right colon. No
opaque calculi are seen. IUD is noted centrally within the pelvis.
No bony abnormality is seen.
IMPRESSION: 1. No bowel obstruction.  Minimal amount of feces in the colon.
2. IUD located centrally within the pelvis.

## 2019-11-28 ENCOUNTER — Telehealth: Payer: Self-pay | Admitting: Family Medicine

## 2019-11-28 NOTE — Telephone Encounter (Signed)
Call pt and schedule an appointment to discuss medication and answer her questions.

## 2019-11-28 NOTE — Telephone Encounter (Signed)
Patient is having a cosmetic procedure done in the near future, wanted to advise you of the procedure, and also talk to you about not taking zoloft before the surgery  [phone]

## 2019-12-15 ENCOUNTER — Other Ambulatory Visit: Payer: BC Managed Care – PPO

## 2019-12-15 ENCOUNTER — Encounter: Payer: Self-pay | Admitting: Family Medicine

## 2019-12-15 ENCOUNTER — Ambulatory Visit (INDEPENDENT_AMBULATORY_CARE_PROVIDER_SITE_OTHER): Payer: BC Managed Care – PPO | Admitting: Family Medicine

## 2019-12-15 ENCOUNTER — Other Ambulatory Visit: Payer: Self-pay

## 2019-12-15 VITALS — BP 136/96 | HR 100 | Temp 96.9°F | Resp 14 | Ht 67.0 in | Wt 194.0 lb

## 2019-12-15 DIAGNOSIS — Z01818 Encounter for other preprocedural examination: Secondary | ICD-10-CM | POA: Diagnosis not present

## 2019-12-15 DIAGNOSIS — Z419 Encounter for procedure for purposes other than remedying health state, unspecified: Secondary | ICD-10-CM

## 2019-12-15 NOTE — Progress Notes (Signed)
Subjective:    Patient ID: Amanda Greer, female    DOB: 11/06/1985, 34 y.o.   MRN: [id number]  HPI Patient is a very pleasant 34 year old Caucasian female here today for surgical clearance.  Patient is scheduled to have plastic surgery performed later this year.  She is scheduled to have a fat transfer to the buttocks as well as liposuction of 12 different areas.  She presents today for surgical clearance.  She has no significant past medical history.  Specifically she has no history of cardiovascular disease.  She denies any chest pain shortness of breath dyspnea on exertion or angina.  She denies any history of heavy bleeding.  She denies any family history of bleeding disorders.  She denies any history of liver or kidney problems.  She is not taking any medication as she has weaned herself off Zoloft in the last year.  Her biggest concern is her elevated BMI.  She states that she has gained more than 20 pounds in the last year.  Her BMI is now greater than 30.  She says she has tried unsuccessfully way the lifestyle changes prompting her to seek surgical correction. Past Medical History:  Diagnosis Date  . Anxiety 2010  . Bell's palsy    No past surgical history on file.  No current outpatient medications on file prior to visit.   No current facility-administered medications on file prior to visit.   No Known Allergies Social History   Socioeconomic History  . Marital status: Single    Spouse name: Not on file  . Number of children: Not on file  . Years of education: Not on file  . Highest education level: Not on file  Occupational History  . Not on file  Tobacco Use  . Smoking status: Never Smoker  . Smokeless tobacco: Never Used  Substance and Sexual Activity  . Alcohol use: No  . Drug use: No  . Sexual activity: Yes    Birth control/protection: I.U.D.  Other Topics Concern  . Not on file  Social History Narrative   Entered 05/2014:    At home, It is patient's  daughter, and daughter's father. It is the 3 of them at home.   Patient currently working at a bank.   Social Determinants of Health   Financial Resource Strain:   . Difficulty of Paying Living Expenses:   Food Insecurity:   . Worried About Charity fundraiser in the Last Year:   . Arboriculturist in the Last Year:   Transportation Needs:   . Film/video editor (Medical):   Marland Kitchen Lack of Transportation (Non-Medical):   Physical Activity:   . Days of Exercise per Week:   . Minutes of Exercise per Session:   Stress:   . Feeling of Stress :   Social Connections:   . Frequency of Communication with Friends and Family:   . Frequency of Social Gatherings with Friends and Family:   . Attends Religious Services:   . Active Member of Clubs or Organizations:   . Attends Archivist Meetings:   Marland Kitchen Marital Status:   Intimate Partner Violence:   . Fear of Current or Ex-Partner:   . Emotionally Abused:   Marland Kitchen Physically Abused:   . Sexually Abused:    Family History  Problem Relation Age of Onset  . Hyperlipidemia Mother   . Hypertension Mother       Review of Systems  All other systems reviewed and are negative.  Objective:   Physical Exam Vitals reviewed.  Constitutional:      General: She is not in acute distress.    Appearance: Normal appearance. She is normal weight. She is not ill-appearing or toxic-appearing.  HENT:     Head: Normocephalic and atraumatic.     Right Ear: Tympanic membrane and ear canal normal.     Left Ear: Tympanic membrane and ear canal normal.     Nose: Nose normal. No congestion or rhinorrhea.     Mouth/Throat:     Mouth: Mucous membranes are moist.     Pharynx: Oropharynx is clear. No oropharyngeal exudate or posterior oropharyngeal erythema.  Eyes:     General: No scleral icterus.       Right eye: No discharge.        Left eye: No discharge.     Extraocular Movements: Extraocular movements intact.     Conjunctiva/sclera:  Conjunctivae normal.     Pupils: Pupils are equal, round, and reactive to light.  Neck:     Vascular: No carotid bruit.  Cardiovascular:     Rate and Rhythm: Normal rate and regular rhythm.     Pulses: Normal pulses.     Heart sounds: Normal heart sounds. No murmur. No friction rub. No gallop.   Pulmonary:     Effort: Pulmonary effort is normal. No respiratory distress.     Breath sounds: Normal breath sounds. No stridor. No wheezing, rhonchi or rales.  Chest:     Chest wall: No tenderness.  Abdominal:     General: Bowel sounds are normal. There is no distension.     Palpations: Abdomen is soft. There is no mass.     Tenderness: There is no abdominal tenderness. There is no guarding or rebound.     Hernia: No hernia is present.  Musculoskeletal:     Cervical back: Neck supple.     Right lower leg: No edema.     Left lower leg: No edema.  Lymphadenopathy:     Cervical: No cervical adenopathy.  Skin:    Coloration: Skin is not jaundiced.     Findings: No erythema, lesion or rash.  Neurological:     General: No focal deficit present.     Mental Status: She is alert and oriented to person, place, and time. Mental status is at baseline.     Cranial Nerves: No cranial nerve deficit.     Sensory: No sensory deficit.     Motor: No weakness.     Coordination: Coordination normal.     Gait: Gait normal.     Deep Tendon Reflexes: Reflexes normal.  Psychiatric:        Mood and Affect: Mood normal.        Behavior: Behavior normal.        Thought Content: Thought content normal.        Judgment: Judgment normal.           Assessment & Plan:  Surgery, elective - Plan: EKG 12-Lead, CBC with Differential/Platelet, COMPLETE METABOLIC PANEL WITH GFR, Hepatitis panel, acute, HIV Antibody (routine testing w rflx), PT with INR/Fingerstick, APTT, hCG, serum, qualitative, Hemoglobin A1c, T4, free, TSH, T3, Free, Urinalysis, Routine w reflex microscopic, Nicotine and Metabs, Urine,  Protime-INR, Nicotine and Cotinine LC/MS/MS, U  Preop exam for internal medicine - Plan: EKG 12-Lead, CBC with Differential/Platelet, COMPLETE METABOLIC PANEL WITH GFR, Hepatitis panel, acute, HIV Antibody (routine testing w rflx), PT with INR/Fingerstick, APTT, hCG, serum, qualitative, Hemoglobin A1c, T4,  free, TSH, T3, Free, Urinalysis, Routine w reflex microscopic, Nicotine and Metabs, Urine, Protime-INR, Nicotine and Cotinine LC/MS/MS, U  Patient's physical exam is completely normal except for elevated blood pressure.  However review of her most recent exam at her gynecologist shows a blood pressure well below 140/90.  She also had a physical exam with me a year ago with a blood pressure below 120/80.  The patient states that she has whitecoat syndrome and is very nervous about having blood draw.  Therefore I asked the patient to check her blood pressure twice a day over the next week and report the values to me in 1 week so that we can determine if this is truly whitecoat syndrome.  However based on previous records this most likely does represent whitecoat syndrome anxiety over lab work.  Her plastic surgeon is requesting several different lab tests including a CBC, CMP, hepatitis panel, PT/INR, PTT, hCG quantitative, hemoglobin A1c, urinalysis, free T3, free T4, TSH, urine nicotine test, HIV, and a twelve-lead EKG.  I obtained a twelve-lead EKG today which shows normal sinus rhythm with no evidence of ischemia or infarction.  I will obtain other lab work requested by her plastic surgeon however I see no restrictions for her upcoming plastic surgery.

## 2019-12-16 LAB — COMPLETE METABOLIC PANEL WITH GFR
AG Ratio: 1.5 (calc) (ref 1.0–2.5)
ALT: 23 U/L (ref 6–29)
AST: 18 U/L (ref 10–30)
Albumin: 4.2 g/dL (ref 3.6–5.1)
Alkaline phosphatase (APISO): 56 U/L (ref 31–125)
BUN: 13 mg/dL (ref 7–25)
CO2: 25 mmol/L (ref 20–32)
Calcium: 9.5 mg/dL (ref 8.6–10.2)
Chloride: 104 mmol/L (ref 98–110)
Creat: 0.79 mg/dL (ref 0.50–1.10)
GFR, Est African American: 116 mL/min/{1.73_m2} (ref >=60)
GFR, Est Non African American: 100 mL/min/{1.73_m2} (ref >=60)
Globulin: 2.8 g/dL (calc) (ref 1.9–3.7)
Glucose, Bld: 113 mg/dL — ABNORMAL HIGH (ref 65–99)
Potassium: 3.7 mmol/L (ref 3.5–5.3)
Sodium: 141 mmol/L (ref 135–146)
Total Bilirubin: 0.4 mg/dL (ref 0.2–1.2)
Total Protein: 7 g/dL (ref 6.1–8.1)

## 2019-12-16 LAB — URINALYSIS, ROUTINE W REFLEX MICROSCOPIC
Bilirubin Urine: NEGATIVE
Glucose, UA: NEGATIVE
Hgb urine dipstick: NEGATIVE
Ketones, ur: NEGATIVE
Leukocytes,Ua: NEGATIVE
Nitrite: NEGATIVE
Protein, ur: NEGATIVE
Specific Gravity, Urine: 1.018 (ref 1.001–1.03)
pH: 6 (ref 5.0–8.0)

## 2019-12-16 LAB — T3, FREE: T3, Free: 4.6 pg/mL — ABNORMAL HIGH (ref 2.3–4.2)

## 2019-12-16 LAB — HEMOGLOBIN A1C
Hgb A1c MFr Bld: 5.4 % of total Hgb (ref <5.7)
Mean Plasma Glucose: 108 (calc)
eAG (mmol/L): 6 (calc)

## 2019-12-16 LAB — CBC WITH DIFFERENTIAL/PLATELET
Absolute Monocytes: 691 cells/uL (ref 200–950)
Basophils Absolute: 43 cells/uL (ref 0–200)
Basophils Relative: 0.6 %
Eosinophils Absolute: 324 cells/uL (ref 15–500)
Eosinophils Relative: 4.5 %
HCT: 41.1 % (ref 35.0–45.0)
Hemoglobin: 13.8 g/dL (ref 11.7–15.5)
Lymphs Abs: 1994 cells/uL (ref 850–3900)
MCH: 29.2 pg (ref 27.0–33.0)
MCHC: 33.6 g/dL (ref 32.0–36.0)
MCV: 87.1 fL (ref 80.0–100.0)
MPV: 11.6 fL (ref 7.5–12.5)
Monocytes Relative: 9.6 %
Neutro Abs: 4147 cells/uL (ref 1500–7800)
Neutrophils Relative %: 57.6 %
Platelets: 279 10*3/uL (ref 140–400)
RBC: 4.72 10*6/uL (ref 3.80–5.10)
RDW: 12.1 % (ref 11.0–15.0)
Total Lymphocyte: 27.7 %
WBC: 7.2 10*3/uL (ref 3.8–10.8)

## 2019-12-16 LAB — APTT: aPTT: 27 s (ref 23–32)

## 2019-12-16 LAB — TSH: TSH: 0.07 mIU/L — ABNORMAL LOW

## 2019-12-16 LAB — HCG, SERUM, QUALITATIVE: Preg, Serum: NEGATIVE

## 2019-12-16 LAB — HEPATITIS PANEL, ACUTE
Hep A IgM: NONREACTIVE
Hep B C IgM: NONREACTIVE
Hepatitis B Surface Ag: NONREACTIVE
Hepatitis C Ab: NONREACTIVE
SIGNAL TO CUT-OFF: 0.01 (ref <1.00)

## 2019-12-16 LAB — PROTIME-INR
INR: 1
Prothrombin Time: 10.6 s (ref 9.0–11.5)

## 2019-12-16 LAB — HIV ANTIBODY (ROUTINE TESTING W REFLEX): HIV 1&2 Ab, 4th Generation: NONREACTIVE

## 2019-12-16 LAB — T4, FREE: Free T4: 1.6 ng/dL (ref 0.8–1.8)

## 2019-12-20 LAB — NICOTINE AND COTININE LC/MS/MS, U
COTININE, URINE: <2 ng/mL
NICOTINE, URINE: <2 ng/mL

## 2019-12-22 ENCOUNTER — Ambulatory Visit (INDEPENDENT_AMBULATORY_CARE_PROVIDER_SITE_OTHER): Payer: BC Managed Care – PPO | Admitting: Family Medicine

## 2019-12-22 ENCOUNTER — Other Ambulatory Visit: Payer: Self-pay

## 2019-12-22 ENCOUNTER — Encounter: Payer: Self-pay | Admitting: Family Medicine

## 2019-12-22 VITALS — BP 126/74 | HR 115 | Temp 97.3°F | Resp 12 | Ht 67.0 in | Wt 193.0 lb

## 2019-12-22 DIAGNOSIS — R7989 Other specified abnormal findings of blood chemistry: Secondary | ICD-10-CM | POA: Diagnosis not present

## 2019-12-22 NOTE — Progress Notes (Signed)
Subjective:    Patient ID: Amanda Greer, female    DOB: 1986/05/16, 34 y.o.   MRN: [id number]  HPI  12/15/19 Patient is a very pleasant 34 year old Caucasian female here today for surgical clearance.  Patient is scheduled to have plastic surgery performed later this year.  She is scheduled to have a fat transfer to the buttocks as well as liposuction of 12 different areas.  She presents today for surgical clearance.  She has no significant past medical history.  Specifically she has no history of cardiovascular disease.  She denies any chest pain shortness of breath dyspnea on exertion or angina.  She denies any history of heavy bleeding.  She denies any family history of bleeding disorders.  She denies any history of liver or kidney problems.  She is not taking any medication as she has weaned herself off Zoloft in the last year.  Her biggest concern is her elevated BMI.  She states that she has gained more than 20 pounds in the last year.  Her BMI is now greater than 30.  She says she has tried unsuccessfully way the lifestyle changes prompting her to seek surgical correction.  At that time, my plan was: Patient's physical exam is completely normal except for elevated blood pressure.  However review of her most recent exam at her gynecologist shows a blood pressure well below 140/90.  She also had a physical exam with me a year ago with a blood pressure below 120/80.  The patient states that she has whitecoat syndrome and is very nervous about having blood draw.  Therefore I asked the patient to check her blood pressure twice a day over the next week and report the values to me in 1 week so that we can determine if this is truly whitecoat syndrome.  However based on previous records this most likely does represent whitecoat syndrome anxiety over lab work.  Her plastic surgeon is requesting several different lab tests including a CBC, CMP, hepatitis panel, PT/INR, PTT, hCG quantitative, hemoglobin A1c,  urinalysis, free T3, free T4, TSH, urine nicotine test, HIV, and a twelve-lead EKG.  I obtained a twelve-lead EKG today which shows normal sinus rhythm with no evidence of ischemia or infarction.  I will obtain other lab work requested by her plastic surgeon however I see no restrictions for her upcoming plastic surgery.  12/22/19 Office Visit on 12/15/2019  Component Date Value Ref Range Status  . WBC 12/15/2019 7.2  3.8 - 10.8 Thousand/uL Final  . RBC 12/15/2019 4.72  3.80 - 5.10 Million/uL Final  . Hemoglobin 12/15/2019 13.8  11.7 - 15.5 g/dL Final  . HCT 12/15/2019 41.1  35.0 - 45.0 % Final  . MCV 12/15/2019 87.1  80.0 - 100.0 fL Final  . MCH 12/15/2019 29.2  27.0 - 33.0 pg Final  . MCHC 12/15/2019 33.6  32.0 - 36.0 g/dL Final  . RDW 12/15/2019 12.1  11.0 - 15.0 % Final  . Platelets 12/15/2019 279  140 - 400 Thousand/uL Final  . MPV 12/15/2019 11.6  7.5 - 12.5 fL Final  . Neutro Abs 12/15/2019 4,147  1,500 - 7,800 cells/uL Final  . Lymphs Abs 12/15/2019 1,994  850 - 3,900 cells/uL Final  . Absolute Monocytes 12/15/2019 691  200 - 950 cells/uL Final  . Eosinophils Absolute 12/15/2019 324  15 - 500 cells/uL Final  . Basophils Absolute 12/15/2019 43  0 - 200 cells/uL Final  . Neutrophils Relative % 12/15/2019 57.6  % Final  .  Total Lymphocyte 12/15/2019 27.7  % Final  . Monocytes Relative 12/15/2019 9.6  % Final  . Eosinophils Relative 12/15/2019 4.5  % Final  . Basophils Relative 12/15/2019 0.6  % Final  . Glucose, Bld 12/15/2019 113* 65 - 99 mg/dL Final   Comment: .            Fasting reference interval . For someone without known diabetes, a glucose value between 100 and 125 mg/dL is consistent with prediabetes and should be confirmed with a follow-up test. .   . BUN 12/15/2019 13  7 - 25 mg/dL Final  . Creat 12/15/2019 0.79  0.50 - 1.10 mg/dL Final  . GFR, Est Non African American 12/15/2019 100  > OR = 60 mL/min/1.45m2 Final  . GFR, Est African American 12/15/2019 116  > OR  = 60 mL/min/1.49m2 Final  . BUN/Creatinine Ratio [id number] NOT APPLICABLE  6 - 22 (calc) Final  . Sodium 12/15/2019 141  135 - 146 mmol/L Final  . Potassium 12/15/2019 3.7  3.5 - 5.3 mmol/L Final  . Chloride 12/15/2019 104  98 - 110 mmol/L Final  . CO2 12/15/2019 25  20 - 32 mmol/L Final  . Calcium 12/15/2019 9.5  8.6 - 10.2 mg/dL Final  . Total Protein 12/15/2019 7.0  6.1 - 8.1 g/dL Final  . Albumin 12/15/2019 4.2  3.6 - 5.1 g/dL Final  . Globulin 12/15/2019 2.8  1.9 - 3.7 g/dL (calc) Final  . AG Ratio 12/15/2019 1.5  1.0 - 2.5 (calc) Final  . Total Bilirubin 12/15/2019 0.4  0.2 - 1.2 mg/dL Final  . Alkaline phosphatase (APISO) 12/15/2019 56  31 - 125 U/L Final  . AST 12/15/2019 18  10 - 30 U/L Final  . ALT 12/15/2019 23  6 - 29 U/L Final  . Hep A IgM 12/15/2019 NON-REACTIVE  NON-REACTI Final  . Hepatitis B Surface Ag 12/15/2019 NON-REACTIVE  NON-REACTI Final  . Hep B C IgM 12/15/2019 NON-REACTIVE  NON-REACTI Final  . Hepatitis C Ab 12/15/2019 NON-REACTIVE  NON-REACTI Final  . SIGNAL TO CUT-OFF 12/15/2019 0.01  <1.00 Final   Comment: . HCV antibody was non-reactive. There is no laboratory  evidence of HCV infection. . In most cases, no further action is required. However, if recent HCV exposure is suspected, a test for HCV RNA (test code [phone]) is suggested. . For additional information please refer to http://education.[facility].com/faq/FAQ22v1 (This link is being provided for informational/ educational purposes only.) . Marland Kitchen For additional information, please refer to  http://education.[facility].com/faq/FAQ202  (This link is being provided for informational/ educational purposes only.) .   Marland Kitchen HIV 1&2 Ab, 4th Generation 12/15/2019 NON-REACTIVE  NON-REACTI Final   Comment: HIV-1 antigen and HIV-1/HIV-2 antibodies were not detected. There is no laboratory evidence of HIV infection. Marland Kitchen PLEASE NOTE: This information has been disclosed to you from records whose  confidentiality may be protected by state law.  If your state requires such protection, then the state law prohibits you from making any further disclosure of the information without the specific written consent of the person to whom it pertains, or as otherwise permitted by law. A general authorization for the release of medical or other information is NOT sufficient for this purpose. . For additional information please refer to http://education.[facility].com/faq/FAQ106 (This link is being provided for informational/ educational purposes only.) . Marland Kitchen The performance of this assay has not been clinically validated in patients less than 88 years old. .   . aPTT 12/15/2019 27  23 - 32 sec  Final   Comment: . This test has not been validated for monitoring unfractionated heparin therapy. For testing that is validated for this type of therapy, please refer to the Heparin Anti-Xa assay (test code [id number]). . For additional information, please refer to http://education.[facility].com/faq/FAQ159 (This link is being provided for  informational/educational purposes only.)   . Preg, Serum 12/15/2019 NEGATIVE   Final   Comment: Reference Range Non-Pregnant: Negative Pregnant:     Positive .   Marland Kitchen Hgb A1c MFr Bld 12/15/2019 5.4  <5.7 % of total Hgb Final   Comment: For the purpose of screening for the presence of diabetes: . <5.7%       Consistent with the absence of diabetes 5.7-6.4%    Consistent with increased risk for diabetes             (prediabetes) > or =6.5%  Consistent with diabetes . This assay result is consistent with a decreased risk of diabetes. . Currently, no consensus exists regarding use of hemoglobin A1c for diagnosis of diabetes in children. . According to American Diabetes Association (ADA) guidelines, hemoglobin A1c <7.0% represents optimal control in non-pregnant diabetic patients. Different metrics may apply to specific patient populations.    Standards of Medical Care in Diabetes(ADA). .   . Mean Plasma Glucose 12/15/2019 108  (calc) Final  . eAG (mmol/L) 12/15/2019 6.0  (calc) Final  . Free T4 12/15/2019 1.6  0.8 - 1.8 ng/dL Final  . TSH 12/15/2019 0.07* mIU/L Final   Comment:           Reference Range .           > or = 20 Years  0.40-4.50 .                Pregnancy Ranges           First trimester    0.26-2.66           Second trimester   0.55-2.73           Third trimester    0.43-2.91   . T3, Free 12/15/2019 4.6* 2.3 - 4.2 pg/mL Final  . Color, Urine 12/15/2019 YELLOW  YELLOW Final  . APPearance 12/15/2019 CLEAR  CLEAR Final  . Specific Gravity, Urine 12/15/2019 1.018  1.001 - 1.03 Final  . pH 12/15/2019 6.0  5.0 - 8.0 Final  . Glucose, UA 12/15/2019 NEGATIVE  NEGATIVE Final  . Bilirubin Urine 12/15/2019 NEGATIVE  NEGATIVE Final  . Ketones, ur 12/15/2019 NEGATIVE  NEGATIVE Final  . Hgb urine dipstick 12/15/2019 NEGATIVE  NEGATIVE Final  . Protein, ur 12/15/2019 NEGATIVE  NEGATIVE Final  . Nitrite 12/15/2019 NEGATIVE  NEGATIVE Final  . Chalmers Guest 12/15/2019 NEGATIVE  NEGATIVE Final  . INR 12/15/2019 1.0   Final   Comment: Reference Range                     0.9-1.1 Moderate-intensity Warfarin Therapy 2.0-3.0 Higher-intensity Warfarin Therapy   3.0-4.0  .   Marland Kitchen Prothrombin Time 12/15/2019 10.6  9.0 - 11.5 sec Final   Comment: For additional information, please refer to http://education.[facility].com/faq/FAQ104 (This link is being provided for informational/ educational purposes only.)   . NICOTINE, URINE 12/15/2019 <2  ng/mL Final  . COTININE, URINE 12/15/2019 <2  ng/mL Final   Comment: .                      Reference Range: .  Nicotine, Urine                              Smokers: 200-700 ng/mL                           Nonsmokers: < or = 17 ng/mL .                             Cotinine, Urine                              Smokers: [phone] ng/mL                            Nonsmokers: < or = 20 ng/mL . Individuals exposed to second-hand or passive tobacco smoke may demonstrate concentrations of nicotine and cotinine greater than those indicated for non-smokers. . . This test was developed and its analytical performance characteristics have been determined by [place], [place]. It has not been cleared or approved by the U.S. Food and Drug Administration. This assay has been validated pursuant to the CLIA regulations and is used for clinical purposes. .    As shown above, labs showed mild hyperthyroidism despite the patient having no symptoms of such.  Here to discuss.  Discontinuing Xanax no, I know if he wanted to sign a 1 Past Medical History:  Diagnosis Date  . Anxiety 2010  . Bell's palsy    No past surgical history on file.  No current outpatient medications on file prior to visit.   No current facility-administered medications on file prior to visit.   No Known Allergies Social History   Socioeconomic History  . Marital status: Single    Spouse name: Not on file  . Number of children: Not on file  . Years of education: Not on file  . Highest education level: Not on file  Occupational History  . Not on file  Tobacco Use  . Smoking status: Never Smoker  . Smokeless tobacco: Never Used  Substance and Sexual Activity  . Alcohol use: No  . Drug use: No  . Sexual activity: Yes    Birth control/protection: I.U.D.  Other Topics Concern  . Not on file  Social History Narrative   Entered 05/2014:    At home, It is patient's daughter, and daughter's father. It is the 3 of them at home.   Patient currently working at a bank.   Social Determinants of Health   Financial Resource Strain:   . Difficulty of Paying Living Expenses:   Food Insecurity:   . Worried About Charity fundraiser in the Last Year:   . Arboriculturist in the Last Year:   Transportation Needs:   . Film/video editor  (Medical):   Marland Kitchen Lack of Transportation (Non-Medical):   Physical Activity:   . Days of Exercise per Week:   . Minutes of Exercise per Session:   Stress:   . Feeling of Stress :   Social Connections:   . Frequency of Communication with Friends and Family:   . Frequency of Social Gatherings with Friends and Family:   . Attends Religious Services:   . Active Member of Clubs or Organizations:   . Attends Archivist  Meetings:   Marland Kitchen Marital Status:   Intimate Partner Violence:   . Fear of Current or Ex-Partner:   . Emotionally Abused:   Marland Kitchen Physically Abused:   . Sexually Abused:    Family History  Problem Relation Age of Onset  . Hyperlipidemia Mother   . Hypertension Mother       Review of Systems  All other systems reviewed and are negative.      Objective:   Physical Exam Vitals reviewed.  Constitutional:      General: She is not in acute distress.    Appearance: Normal appearance. She is normal weight. She is not ill-appearing or toxic-appearing.  HENT:     Head: Normocephalic and atraumatic.     Right Ear: Tympanic membrane and ear canal normal.     Left Ear: Tympanic membrane and ear canal normal.     Nose: Nose normal. No congestion or rhinorrhea.     Mouth/Throat:     Mouth: Mucous membranes are moist.     Pharynx: Oropharynx is clear. No oropharyngeal exudate or posterior oropharyngeal erythema.  Eyes:     General: No scleral icterus.       Right eye: No discharge.        Left eye: No discharge.     Extraocular Movements: Extraocular movements intact.     Conjunctiva/sclera: Conjunctivae normal.     Pupils: Pupils are equal, round, and reactive to light.  Neck:     Vascular: No carotid bruit.  Cardiovascular:     Rate and Rhythm: Normal rate and regular rhythm.     Pulses: Normal pulses.     Heart sounds: Normal heart sounds. No murmur. No friction rub. No gallop.   Pulmonary:     Effort: Pulmonary effort is normal. No respiratory distress.      Breath sounds: Normal breath sounds. No stridor. No wheezing, rhonchi or rales.  Chest:     Chest wall: No tenderness.  Abdominal:     General: Bowel sounds are normal. There is no distension.     Palpations: Abdomen is soft. There is no mass.     Tenderness: There is no abdominal tenderness. There is no guarding or rebound.     Hernia: No hernia is present.  Musculoskeletal:     Cervical back: Neck supple.     Right lower leg: No edema.     Left lower leg: No edema.  Lymphadenopathy:     Cervical: No cervical adenopathy.  Skin:    Coloration: Skin is not jaundiced.     Findings: No erythema, lesion or rash.  Neurological:     General: No focal deficit present.     Mental Status: She is alert and oriented to person, place, and time. Mental status is at baseline.     Cranial Nerves: No cranial nerve deficit.     Sensory: No sensory deficit.     Motor: No weakness.     Coordination: Coordination normal.     Gait: Gait normal.     Deep Tendon Reflexes: Reflexes normal.  Psychiatric:        Mood and Affect: Mood normal.        Behavior: Behavior normal.        Thought Content: Thought content normal.        Judgment: Judgment normal.           Assessment & Plan:  Abnormal serum thyroid stimulating hormone (TSH) level - Plan: Thyroid Peroxidase Antibodies (TPO) (REFL),  TRAb (TSH Receptor Binding Antibody), TSH  If anything, the patient has symptoms of hypothyroidism.  I will repeat a TSH to ensure that this is correct.  I will check a TPO antibody to determine if the patient may have Hashimoto's thyroiditis as this can sometimes presents with hyperthyroidism initially prior to becoming hypothyroidism.  I will check a TSH receptor antibody to determine if the patient has Graves' disease.  Depending on the results of these lab test, the patient may need radioiodine uptake scan to determine cause and treatment.

## 2019-12-24 NOTE — Telephone Encounter (Signed)
Pt aware of results and called back wanting to know what to do about the issue with her thyroid?

## 2019-12-25 ENCOUNTER — Other Ambulatory Visit: Payer: Self-pay | Admitting: Family Medicine

## 2019-12-25 ENCOUNTER — Encounter: Payer: Self-pay | Admitting: Family Medicine

## 2019-12-25 DIAGNOSIS — E0590 Thyrotoxicosis, unspecified without thyrotoxic crisis or storm: Secondary | ICD-10-CM

## 2019-12-25 LAB — T4, FREE: Free T4: 2 ng/dL — ABNORMAL HIGH (ref 0.8–1.8)

## 2019-12-25 LAB — TSH: TSH: 0.04 mIU/L — ABNORMAL LOW

## 2019-12-25 LAB — BASIC METABOLIC PANEL WITH GFR
BUN: 14 mg/dL (ref 7–25)
CO2: 23 mmol/L (ref 20–32)
Calcium: 9.2 mg/dL (ref 8.6–10.2)
Chloride: 106 mmol/L (ref 98–110)
Creat: 0.71 mg/dL (ref 0.50–1.10)
GFR, Est African American: 132 mL/min/{1.73_m2} (ref >=60)
GFR, Est Non African American: 113 mL/min/{1.73_m2} (ref >=60)
Glucose, Bld: 107 mg/dL — ABNORMAL HIGH (ref 65–99)
Potassium: 4 mmol/L (ref 3.5–5.3)
Sodium: 139 mmol/L (ref 135–146)

## 2019-12-25 LAB — T3, FREE: T3, Free: 6.3 pg/mL — ABNORMAL HIGH (ref 2.3–4.2)

## 2019-12-25 LAB — THYROID PEROXIDASE ANTIBODIES (TPO) (REFL): Thyroperoxidase Ab SerPl-aCnc: <1 IU/mL (ref <9)

## 2019-12-25 LAB — TRAB (TSH RECEPTOR BINDING ANTIBODY): TRAB: <1 IU/L (ref <=2.00)

## 2019-12-30 ENCOUNTER — Other Ambulatory Visit: Payer: Self-pay | Admitting: Family Medicine

## 2019-12-30 DIAGNOSIS — E0590 Thyrotoxicosis, unspecified without thyrotoxic crisis or storm: Secondary | ICD-10-CM

## 2019-12-30 DIAGNOSIS — R7989 Other specified abnormal findings of blood chemistry: Secondary | ICD-10-CM

## 2020-01-15 ENCOUNTER — Encounter (HOSPITAL_COMMUNITY): Payer: BC Managed Care – PPO

## 2020-01-16 ENCOUNTER — Encounter (HOSPITAL_COMMUNITY): Payer: BC Managed Care – PPO

## 2020-01-29 ENCOUNTER — Encounter (HOSPITAL_COMMUNITY): Payer: BC Managed Care – PPO

## 2020-01-30 ENCOUNTER — Encounter (HOSPITAL_COMMUNITY): Payer: BC Managed Care – PPO

## 2020-03-22 ENCOUNTER — Encounter: Payer: BC Managed Care – PPO | Admitting: Family Medicine

## 2020-04-19 ENCOUNTER — Encounter: Payer: BC Managed Care – PPO | Admitting: Family Medicine

## 2020-05-18 ENCOUNTER — Other Ambulatory Visit: Payer: Self-pay

## 2020-05-18 ENCOUNTER — Ambulatory Visit
Admission: EM | Admit: 2020-05-18 | Discharge: 2020-05-18 | Disposition: A | Discharge status: Home / Self Care | Payer: BC Managed Care – PPO | Attending: Emergency Medicine | Admitting: Emergency Medicine

## 2020-05-18 DIAGNOSIS — R52 Pain, unspecified: Secondary | ICD-10-CM

## 2020-05-18 DIAGNOSIS — J069 Acute upper respiratory infection, unspecified: Secondary | ICD-10-CM | POA: Diagnosis not present

## 2020-05-18 DIAGNOSIS — Z1152 Encounter for screening for COVID-19: Secondary | ICD-10-CM

## 2020-05-18 MED ORDER — BENZONATATE 100 MG PO CAPS: 100.0000 mg | ORAL_CAPSULE | Freq: Three times a day (TID) | ORAL | 0 refills | Status: DC

## 2020-05-18 MED ORDER — CETIRIZINE HCL 10 MG PO TABS: 10.0000 mg | ORAL_TABLET | Freq: Every day | ORAL | 0 refills | Status: DC

## 2020-05-18 MED ORDER — PREDNISONE 10 MG PO TABS: 20.0000 mg | ORAL_TABLET | Freq: Every day | ORAL | 0 refills | Status: DC

## 2020-05-18 MED ORDER — FLUTICASONE PROPIONATE 50 MCG/ACT NA SUSP: 1.0000 | Freq: Every day | NASAL | 0 refills | Status: DC

## 2020-05-18 NOTE — ED Provider Notes (Signed)
[place]   [id number] 05/18/20 Arrival Time: 2376   CC: COVID symptoms  SUBJECTIVE: History from: patient.  Amanda Greer is a 34 y.o. female who presents to the urgent care for complaint of chills and fever, cough, headache, and body aches that started yesterday.  D reported positive Covid exposure.  Denies recent travel.  Has tried OTC medication without relief.  Denies alleviating or aggravating factors.  Denies previous symptoms in the past.   Denies , fatigue, sinus pain, rhinorrhea, sore throat, SOB, wheezing, chest pain, nausea, changes in bowel or bladder habits.     ROS: As per HPI.  All other pertinent ROS negative.      Past Medical History:  Diagnosis Date  . Anxiety 2010  . Bell's palsy    Past Surgical History:  Procedure Laterality Date  . COSMETIC SURGERY N/A    [facility] 05/18/2020   No Known Allergies No current facility-administered medications on file prior to encounter.   No current outpatient medications on file prior to encounter.   Social History   Socioeconomic History  . Marital status: Single    Spouse name: Not on file  . Number of children: Not on file  . Years of education: Not on file  . Highest education level: Not on file  Occupational History  . Not on file  Tobacco Use  . Smoking status: Never Smoker  . Smokeless tobacco: Never Used  Substance and Sexual Activity  . Alcohol use: No  . Drug use: No  . Sexual activity: Yes    Birth control/protection: I.U.D.  Other Topics Concern  . Not on file  Social History Narrative   Entered 05/2014:    At home, It is patient's daughter, and daughter's father. It is the 3 of them at home.   Patient currently working at a bank.   Social Determinants of Health   Financial Resource Strain:   . Difficulty of Paying Living Expenses: Not on file  Food Insecurity:   . Worried About Charity fundraiser in the Last Year: Not on file  . Ran Out of Food in the Last Year: Not on  file  Transportation Needs:   . Lack of Transportation (Medical): Not on file  . Lack of Transportation (Non-Medical): Not on file  Physical Activity:   . Days of Exercise per Week: Not on file  . Minutes of Exercise per Session: Not on file  Stress:   . Feeling of Stress : Not on file  Social Connections:   . Frequency of Communication with Friends and Family: Not on file  . Frequency of Social Gatherings with Friends and Family: Not on file  . Attends Religious Services: Not on file  . Active Member of Clubs or Organizations: Not on file  . Attends Archivist Meetings: Not on file  . Marital Status: Not on file  Intimate Partner Violence:   . Fear of Current or Ex-Partner: Not on file  . Emotionally Abused: Not on file  . Physically Abused: Not on file  . Sexually Abused: Not on file   Family History  Problem Relation Age of Onset  . Hyperlipidemia Mother   . Hypertension Mother     OBJECTIVE:  Vitals:   05/18/20 1823  BP: 122/86  Pulse: (!) 103  Resp: 16  Temp: 99.5 F (37.5 C)  SpO2: 97%     General appearance: alert; appears fatigued, but nontoxic; speaking in full sentences and tolerating own secretions HEENT:  NCAT; Ears: EACs clear, TMs pearly gray; Eyes: PERRL.  EOM grossly intact. Sinuses: nontender; Nose: nares patent without rhinorrhea, Throat: oropharynx clear, tonsils non erythematous or enlarged, uvula midline  Neck: supple without LAD Lungs: unlabored respirations, symmetrical air entry; cough: mild; no respiratory distress; CTAB Heart: regular rate and rhythm.  Radial pulses 2+ symmetrical bilaterally Skin: warm and dry Psychological: alert and cooperative; normal mood and affect  LABS:  No results found for this or any previous visit (from the past 24 hour(s)).   ASSESSMENT & PLAN:  1. URI with cough and congestion   2. Generalized body aches   3. Encounter for screening for COVID-19     Meds ordered this encounter  Medications    . cetirizine (ZYRTEC ALLERGY) 10 MG tablet    Sig: Take 1 tablet (10 mg total) by mouth daily.    Dispense:  30 tablet    Refill:  0  . fluticasone (FLONASE) 50 MCG/ACT nasal spray    Sig: Place 1 spray into both nostrils daily for 14 days.    Dispense:  16 g    Refill:  0  . benzonatate (TESSALON) 100 MG capsule    Sig: Take 1 capsule (100 mg total) by mouth every 8 (eight) hours.    Dispense:  30 capsule    Refill:  0  . predniSONE (DELTASONE) 10 MG tablet    Sig: Take 2 tablets (20 mg total) by mouth daily.    Dispense:  15 tablet    Refill:  0    Discharge Instructions   COVID testing ordered.  It will take between 2-7 days for test results.  Someone will contact you regarding abnormal results.    In the meantime: You should remain isolated in your home for 10 days from symptom onset AND greater than 24 hours after symptoms resolution (absence of fever without the use of fever-reducing medication and improvement in respiratory symptoms), whichever is longer Get plenty of rest and push fluids Tessalon Perles prescribed for cough Zyrtec for nasal congestion, runny nose, and/or sore throat Flonase for nasal congestion and runny nose Low-dose prednisone was prescribed Use medications daily for symptom relief Use OTC medications like ibuprofen or tylenol as needed fever or pain Call or go to the ED if you have any new or worsening symptoms such as fever, worsening cough, shortness of breath, chest tightness, chest pain, turning blue, changes in mental status, etc...   Reviewed expectations re: course of current medical issues. Questions answered. Outlined signs and symptoms indicating need for more acute intervention. Patient verbalized understanding. After Visit Summary given.      Note: This document was prepared using Dragon voice recognition software and may include unintentional dictation errors.    Emerson Monte, FNP 05/18/20 [phone]

## 2020-05-18 NOTE — Discharge Instructions (Addendum)
COVID testing ordered.  It will take between 2-7 days for test results.  Someone will contact you regarding abnormal results.    In the meantime: You should remain isolated in your home for 10 days from symptom onset AND greater than 24 hours after symptoms resolution (absence of fever without the use of fever-reducing medication and improvement in respiratory symptoms), whichever is longer Get plenty of rest and push fluids Tessalon Perles prescribed for cough Zyrtec for nasal congestion, runny nose, and/or sore throat Flonase for nasal congestion and runny nose Low-dose prednisone was prescribed Use medications daily for symptom relief Use OTC medications like ibuprofen or tylenol as needed fever or pain Call or go to the ED if you have any new or worsening symptoms such as fever, worsening cough, shortness of breath, chest tightness, chest pain, turning blue, changes in mental status, etc...  

## 2020-05-18 NOTE — ED Triage Notes (Signed)
Pt presents with complaints of headache, cough, fever, chills, and body aches that started yesterday. Reports indirect contact with covid positive person.

## 2020-05-20 ENCOUNTER — Encounter: Payer: BC Managed Care – PPO | Admitting: Family Medicine

## 2020-05-20 LAB — SARS-COV-2, NAA 2 DAY TAT

## 2020-05-20 LAB — NOVEL CORONAVIRUS, NAA: SARS-CoV-2, NAA: DETECTED — AB

## 2020-05-22 ENCOUNTER — Telehealth: Payer: Self-pay | Admitting: Family

## 2020-05-22 NOTE — Telephone Encounter (Signed)
Called to Discuss with patient about Covid symptoms and the use of the monoclonal antibody infusion for those with mild to moderate Covid symptoms and at a high risk of hospitalization.     Pt appears to qualify for this infusion due to co-morbid conditions and/or a member of an at-risk group in accordance with the FDA Emergency Use Authorization.    Unable to reach pt. Onset of symptoms 8/23 with symptoms of fever, chills, cough, headache and bodyaches.  Message left with hotline information.   Terri Piedra, NP

## 2020-05-24 ENCOUNTER — Telehealth (HOSPITAL_COMMUNITY): Payer: Self-pay | Admitting: Emergency Medicine

## 2020-05-24 NOTE — Telephone Encounter (Signed)
Patient called stating she is concerned for her continued cough which has not been relieved by the prescribed medications.  Patient is COVID+, around Day 7.  Patient was sent home on prednisone.  Encouraged her to come in for re-evaluation of her heart and lungs and to r/o need for chest xray.  Patient verbalized understanding.

## 2020-05-25 ENCOUNTER — Other Ambulatory Visit: Payer: Self-pay

## 2020-05-25 ENCOUNTER — Ambulatory Visit
Admission: EM | Admit: 2020-05-25 | Discharge: 2020-05-25 | Disposition: A | Discharge status: Home / Self Care | Payer: BC Managed Care – PPO | Attending: Emergency Medicine | Admitting: Emergency Medicine

## 2020-05-25 ENCOUNTER — Ambulatory Visit: Payer: Self-pay

## 2020-05-25 DIAGNOSIS — R0602 Shortness of breath: Secondary | ICD-10-CM

## 2020-05-25 DIAGNOSIS — U071 COVID-19: Secondary | ICD-10-CM | POA: Diagnosis not present

## 2020-05-25 MED ORDER — ALBUTEROL SULFATE HFA 108 (90 BASE) MCG/ACT IN AERS: 1.0000 | INHALATION_SPRAY | Freq: Four times a day (QID) | RESPIRATORY_TRACT | 0 refills | Status: DC | PRN

## 2020-05-25 MED ORDER — ALBUTEROL SULFATE HFA 108 (90 BASE) MCG/ACT IN AERS: 2.0000 | INHALATION_SPRAY | Freq: Once | RESPIRATORY_TRACT | Status: DC

## 2020-05-25 NOTE — ED Provider Notes (Signed)
[place]   [id number] 05/25/20 Arrival Time: 1107   CC: COVID infection  SUBJECTIVE: History from: patient.  Amanda Greer is a 34 y.o. female who presents with SOB associated with cough x 1 week.  Diagnosed with COVID on 05/18/20.  Has tried tessalon perles and prednisone with minimal relief.  Symptoms are made worse with cough.   Denies fever, chills, fatigue, sinus pain, rhinorrhea, sore throat, wheezing, chest pain, nausea, changes in bowel or bladder habits.    Denies calf pain or swelling, recent long travel, recent surgery, tobacco use, malignancy, pregnancy, hx of blood clot.     ROS: As per HPI.  All other pertinent ROS negative.     Past Medical History:  Diagnosis Date  . Anxiety 2010  . Bell's palsy    Past Surgical History:  Procedure Laterality Date  . COSMETIC SURGERY N/A    [facility] 05/18/2020   No Known Allergies No current facility-administered medications on file prior to encounter.   Current Outpatient Medications on File Prior to Encounter  Medication Sig Dispense Refill  . benzonatate (TESSALON) 100 MG capsule Take 1 capsule (100 mg total) by mouth every 8 (eight) hours. 30 capsule 0  . cetirizine (ZYRTEC ALLERGY) 10 MG tablet Take 1 tablet (10 mg total) by mouth daily. 30 tablet 0  . fluticasone (FLONASE) 50 MCG/ACT nasal spray Place 1 spray into both nostrils daily for 14 days. 16 g 0  . predniSONE (DELTASONE) 10 MG tablet Take 2 tablets (20 mg total) by mouth daily. 15 tablet 0   Social History   Socioeconomic History  . Marital status: Single    Spouse name: Not on file  . Number of children: Not on file  . Years of education: Not on file  . Highest education level: Not on file  Occupational History  . Not on file  Tobacco Use  . Smoking status: Never Smoker  . Smokeless tobacco: Never Used  Substance and Sexual Activity  . Alcohol use: No  . Drug use: No  . Sexual activity: Yes    Birth control/protection: I.U.D.  Other  Topics Concern  . Not on file  Social History Narrative   Entered 05/2014:    At home, It is patient's daughter, and daughter's father. It is the 3 of them at home.   Patient currently working at a bank.   Social Determinants of Health   Financial Resource Strain:   . Difficulty of Paying Living Expenses: Not on file  Food Insecurity:   . Worried About Charity fundraiser in the Last Year: Not on file  . Ran Out of Food in the Last Year: Not on file  Transportation Needs:   . Lack of Transportation (Medical): Not on file  . Lack of Transportation (Non-Medical): Not on file  Physical Activity:   . Days of Exercise per Week: Not on file  . Minutes of Exercise per Session: Not on file  Stress:   . Feeling of Stress : Not on file  Social Connections:   . Frequency of Communication with Friends and Family: Not on file  . Frequency of Social Gatherings with Friends and Family: Not on file  . Attends Religious Services: Not on file  . Active Member of Clubs or Organizations: Not on file  . Attends Archivist Meetings: Not on file  . Marital Status: Not on file  Intimate Partner Violence:   . Fear of Current or Ex-Partner: Not on file  .  Emotionally Abused: Not on file  . Physically Abused: Not on file  . Sexually Abused: Not on file   Family History  Problem Relation Age of Onset  . Hyperlipidemia Mother   . Hypertension Mother     OBJECTIVE:  Vitals:   05/25/20 1147  BP: 129/86  Pulse: (!) 103  Resp: 20  Temp: 99 F (37.2 C)  SpO2: 95%     General appearance: alert; appears mildly fatigued, but nontoxic; speaking in full sentences and tolerating own secretions HEENT: NCAT; Ears: EACs clear, TMs pearly gray; Eyes: PERRL.  EOM grossly intact. Sinuses: nontender; Nose: nares patent without rhinorrhea, Throat: oropharynx clear, tonsils non erythematous or enlarged, uvula midline  Neck: supple without LAD Lungs: unlabored respirations, symmetrical air entry;  cough: mild; no respiratory distress; CTAB Heart: regular rate and rhythm.   Skin: warm and dry Psychological: alert and cooperative; normal mood and affect  ASSESSMENT & PLAN:  1. COVID-19 virus infection   2. Shortness of breath     Meds ordered this encounter  Medications  . albuterol (VENTOLIN HFA) 108 (90 Base) MCG/ACT inhaler 2 puff   Unable to rule out blood clot in urgent care setting.  Offered patient further evaluation and management in the ED.  Patient declines at this time and would like to try outpatient therapy first.  Aware of the risk associated with this decision including missed diagnosis, organ damage, organ failure, and/or death.  Patient aware and in agreement.     Get plenty of rest and push fluids Continue with prescribed medications Inhaler given in office.  Use as needed for shortness of breath Use OTC medications like ibuprofen or tylenol as needed fever or pain Follow up with respiratory clinic as needed Go to the ED if you have any new or worsening symptoms such as fever, worsening cough, shortness of breath, chest tightness, chest pain, turning blue, changes in mental status, etc...   Reviewed expectations re: course of current medical issues. Questions answered. Outlined signs and symptoms indicating need for more acute intervention. Patient verbalized understanding. After Visit Summary given.         Lestine Box, PA-C 05/25/20 1217

## 2020-05-25 NOTE — Discharge Instructions (Signed)
Unable to rule out blood clot in urgent care setting.  Offered patient further evaluation and management in the ED.  Patient declines at this time and would like to try outpatient therapy first.  Aware of the risk associated with this decision including missed diagnosis, organ damage, organ failure, and/or death.  Patient aware and in agreement.     Get plenty of rest and push fluids Continue with prescribed medications Inhaler given in office.  Use as needed for shortness of breath Use OTC medications like ibuprofen or tylenol as needed fever or pain Follow up with respiratory clinic as needed Go to the ED if you have any new or worsening symptoms such as fever, worsening cough, shortness of breath, chest tightness, chest pain, turning blue, changes in mental status, etc..Marland Kitchen

## 2020-05-25 NOTE — ED Triage Notes (Signed)
Pt return for reevaluation of SOB , pt 88% after ambulation then up to 95%, pt states sob is whih coughing

## 2020-10-08 ENCOUNTER — Other Ambulatory Visit: Payer: Self-pay

## 2020-10-08 ENCOUNTER — Telehealth (INDEPENDENT_AMBULATORY_CARE_PROVIDER_SITE_OTHER): Payer: BC Managed Care – PPO | Admitting: Family Medicine

## 2020-10-08 DIAGNOSIS — J029 Acute pharyngitis, unspecified: Secondary | ICD-10-CM | POA: Diagnosis not present

## 2020-10-08 MED ORDER — AMOXICILLIN 875 MG PO TABS: 875.0000 mg | ORAL_TABLET | Freq: Two times a day (BID) | ORAL | 0 refills | Status: DC

## 2020-10-08 NOTE — Progress Notes (Signed)
Subjective:    Patient ID: Amanda Greer, female    DOB: 1986/02/01, 35 y.o.   MRN: [id number]  HPI  Patient was originally being seen as a telephone visit.  Phone call began at 1028.  Phone call concluded at 1037.  At that time we determined that the patient should come in for a actual visit.  Patient is currently at work.  I am currently in my office.  She initially consented to be seen by telephone however again we decided that she should drive by the office for testing so she was also seen in our parking lot.  Patient has not had her COVID-vaccine.  She did have COVID in August or September so most likely the delta variant.  2 days ago she developed a sore throat.  She then developed a fever to 101.6.  She reports a severe sore throat x2 days.  She is also having some mild rhinorrhea.  She has some mild coughing.  To her knowledge she has never had mono.  She has not had any exposure to strep throat that she is aware of.  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  She denies any shortness of breath or chest pain.  She is unable to visualize her posterior oropharynx to see if there is any exudate. Past Medical History:  Diagnosis Date  . Anxiety 2010  . Bell's palsy    Past Surgical History:  Procedure Laterality Date  . COSMETIC SURGERY N/A    [facility] 05/18/2020   Current Outpatient Medications on File Prior to Visit  Medication Sig Dispense Refill  . albuterol (VENTOLIN HFA) 108 (90 Base) MCG/ACT inhaler Inhale 1-2 puffs into the lungs every 6 (six) hours as needed for wheezing or shortness of breath. 18 g 0  . benzonatate (TESSALON) 100 MG capsule Take 1 capsule (100 mg total) by mouth every 8 (eight) hours. 30 capsule 0  . cetirizine (ZYRTEC ALLERGY) 10 MG tablet Take 1 tablet (10 mg total) by mouth daily. 30 tablet 0  . fluticasone (FLONASE) 50 MCG/ACT nasal spray Place 1 spray into both  nostrils daily for 14 days. 16 g 0  . predniSONE (DELTASONE) 10 MG tablet Take 2 tablets (20 mg total) by mouth daily. 15 tablet 0   No current facility-administered medications on file prior to visit.   No Known Allergies Social History   Socioeconomic History  . Marital status: Single    Spouse name: Not on file  . Number of children: Not on file  . Years of education: Not on file  . Highest education level: Not on file  Occupational History  . Not on file  Tobacco Use  . Smoking status: Never Smoker  . Smokeless tobacco: Never Used  Substance and Sexual Activity  . Alcohol use: No  . Drug use: No  . Sexual activity: Yes    Birth control/protection: I.U.D.  Other Topics Concern  . Not on file  Social History Narrative   Entered 05/2014:    At home, It is patient's daughter, and daughter's father. It is the 3 of them at home.   Patient currently working at a bank.   Social Determinants of Health   Financial Resource Strain: Not on file  Food Insecurity: Not on file  Transportation Needs: Not on file  Physical Activity: Not on file  Stress: Not on file  Social  Connections: Not on file  Intimate Partner Violence: Not on file     Review of Systems  All other systems reviewed and are negative.      Objective:   Physical Exam Constitutional:      General: She is not in acute distress.    Appearance: She is normal weight. She is not ill-appearing or toxic-appearing.  HENT:     Nose: Rhinorrhea present. No congestion.     Mouth/Throat:     Mouth: Mucous membranes are moist.     Pharynx: Posterior oropharyngeal erythema present.  Cardiovascular:     Rate and Rhythm: Normal rate and regular rhythm.     Heart sounds: Normal heart sounds.  Pulmonary:     Effort: Pulmonary effort is normal. No respiratory distress.     Breath sounds: Normal breath sounds. No wheezing, rhonchi or rales.  Lymphadenopathy:     Cervical: Cervical adenopathy present.  Neurological:      Mental Status: She is alert.           Assessment & Plan:  Sore throat - Plan: SARS-COV-2 RNA,(COVID-19) QUAL NAAT  Differential diagnosis includes viral pharyngitis, strep throat, mono, COVID-19.  Given her current position with the public I recommended that she come to our office for COVID testing as the home test could be negative.  I would also like to perform an evaluation and examine her posterior oropharynx to determine if it appears consistent with strep throat.  If there is significant erythema in the posterior oropharynx or exudate I will treat her presumptively for strep throat with amoxicillin 875 mg twice daily for 10 days.  Patient will come by our office for testing.

## 2020-10-10 LAB — SARS-COV-2 RNA,(COVID-19) QUALITATIVE NAAT: SARS CoV2 RNA: DETECTED — CR

## 2021-09-20 ENCOUNTER — Encounter: Payer: BC Managed Care – PPO | Admitting: Family Medicine

## 2021-10-07 ENCOUNTER — Other Ambulatory Visit: Payer: Self-pay

## 2021-10-07 ENCOUNTER — Encounter: Payer: Self-pay | Admitting: Family Medicine

## 2021-10-07 ENCOUNTER — Ambulatory Visit (INDEPENDENT_AMBULATORY_CARE_PROVIDER_SITE_OTHER): Payer: BC Managed Care – PPO | Admitting: Family Medicine

## 2021-10-07 VITALS — BP 112/90 | HR 97 | Temp 98.2°F | Resp 18 | Ht 67.0 in | Wt 185.4 lb

## 2021-10-07 DIAGNOSIS — Z6829 Body mass index (BMI) 29.0-29.9, adult: Secondary | ICD-10-CM | POA: Diagnosis not present

## 2021-10-07 DIAGNOSIS — Z0000 Encounter for general adult medical examination without abnormal findings: Secondary | ICD-10-CM

## 2021-10-07 DIAGNOSIS — E0590 Thyrotoxicosis, unspecified without thyrotoxic crisis or storm: Secondary | ICD-10-CM | POA: Diagnosis not present

## 2021-10-07 NOTE — Progress Notes (Signed)
Subjective:    Patient ID: Amanda Greer, female    DOB: Aug 27, 1986, 36 y.o.   MRN: [id number]  HPI  I last saw the patient in 2021 and at that time she had a TSH consistent with hyperthyroidism.  We had started a work-up to evaluate for possible Graves' disease however the work-up was never completed.  She has not had any follow-up lab work since that follow-up for low TSH.  However she denies any symptoms.  She denies any palpitations or weight loss or weight gain.  She denies any alopecia.  She denies any racing heart rate or anxiety.  She sees her gynecologist for breast exams and Pap smear.  She is not yet due for mammogram.  She is not yet due for colonoscopy.  Otherwise she is doing well.  We discussed her vaccines which she declines a tetanus shot.  She declines a flu shot.  She declines a COVID booster Past Medical History:  Diagnosis Date   Anxiety 2010   Bell's palsy    Past Surgical History:  Procedure Laterality Date   COSMETIC SURGERY N/A    [facility] 05/18/2020    No current outpatient medications on file prior to visit.   No current facility-administered medications on file prior to visit.   No Known Allergies Social History   Socioeconomic History   Marital status: Single    Spouse name: Not on file   Number of children: Not on file   Years of education: Not on file   Highest education level: Not on file  Occupational History   Not on file  Tobacco Use   Smoking status: Never   Smokeless tobacco: Never  Substance and Sexual Activity   Alcohol use: No   Drug use: No   Sexual activity: Yes    Birth control/protection: I.U.D.  Other Topics Concern   Not on file  Social History Narrative   Entered 05/2014:    At home, It is patient's daughter, and daughter's father. It is the 3 of them at home.   Patient currently working at a bank.   Social Determinants of Health   Financial Resource Strain: Not on file  Food Insecurity: Not on file  Transportation  Needs: Not on file  Physical Activity: Not on file  Stress: Not on file  Social Connections: Not on file  Intimate Partner Violence: Not on file   Family History  Problem Relation Age of Onset   Hyperlipidemia Mother    Hypertension Mother       Review of Systems  All other systems reviewed and are negative.     Objective:   Physical Exam Vitals reviewed.  Constitutional:      General: She is not in acute distress.    Appearance: Normal appearance. She is normal weight. She is not ill-appearing or toxic-appearing.  HENT:     Head: Normocephalic and atraumatic.     Right Ear: Tympanic membrane and ear canal normal.     Left Ear: Tympanic membrane and ear canal normal.     Nose: Nose normal. No congestion or rhinorrhea.     Mouth/Throat:     Mouth: Mucous membranes are moist.     Pharynx: Oropharynx is clear. No oropharyngeal exudate or posterior oropharyngeal erythema.  Eyes:     General: No scleral icterus.       Right eye: No discharge.        Left eye: No discharge.     Extraocular Movements: Extraocular  movements intact.     Conjunctiva/sclera: Conjunctivae normal.     Pupils: Pupils are equal, round, and reactive to light.  Neck:     Vascular: No carotid bruit.  Cardiovascular:     Rate and Rhythm: Normal rate and regular rhythm.     Pulses: Normal pulses.     Heart sounds: Normal heart sounds. No murmur heard.   No friction rub. No gallop.  Pulmonary:     Effort: Pulmonary effort is normal. No respiratory distress.     Breath sounds: Normal breath sounds. No stridor. No wheezing, rhonchi or rales.  Chest:     Chest wall: No tenderness.  Abdominal:     General: Bowel sounds are normal. There is no distension.     Palpations: Abdomen is soft. There is no mass.     Tenderness: There is no abdominal tenderness. There is no guarding or rebound.     Hernia: No hernia is present.  Musculoskeletal:     Cervical back: Neck supple.     Right lower leg: No edema.      Left lower leg: No edema.  Lymphadenopathy:     Cervical: No cervical adenopathy.  Skin:    Coloration: Skin is not jaundiced.     Findings: No erythema, lesion or rash.  Neurological:     General: No focal deficit present.     Mental Status: She is alert and oriented to person, place, and time. Mental status is at baseline.     Cranial Nerves: No cranial nerve deficit.     Sensory: No sensory deficit.     Motor: No weakness.     Coordination: Coordination normal.     Gait: Gait normal.     Deep Tendon Reflexes: Reflexes normal.  Psychiatric:        Mood and Affect: Mood normal.        Behavior: Behavior normal.        Thought Content: Thought content normal.        Judgment: Judgment normal.          Assessment & Plan:  Hyperthyroidism - Plan: CBC with Differential/Platelet, COMPLETE METABOLIC PANEL WITH GFR, TSH, Lipid panel  BMI 29.0-29.9,adult - Plan: CBC with Differential/Platelet, COMPLETE METABOLIC PANEL WITH GFR, Lipid panel  General medical exam I suspect the patient may have had Hashimoto's thyroiditis.  However she is asymptomatic and has no symptoms of hyperthyroidism or hypothyroidism.  I will check a TSH to follow-up today.  Also check a CBC CMP and lipid panel because of her elevated BMI.  Blood pressure is excellent.  The remainder of her physical exam is normal.  Pap smear and breast exams are performed by her gynecologist.

## 2021-10-10 ENCOUNTER — Other Ambulatory Visit: Payer: BC Managed Care – PPO

## 2021-10-10 ENCOUNTER — Other Ambulatory Visit: Payer: Self-pay

## 2021-10-10 LAB — LIPID PANEL
Cholesterol: 181 mg/dL (ref <200)
HDL: 37 mg/dL — ABNORMAL LOW (ref >=50)
LDL Cholesterol (Calc): 123 mg/dL (calc) — ABNORMAL HIGH
Non-HDL Cholesterol (Calc): 144 mg/dL (calc) — ABNORMAL HIGH (ref <130)
Total CHOL/HDL Ratio: 4.9 (calc) (ref <5.0)
Triglycerides: 106 mg/dL (ref <150)

## 2021-10-10 LAB — TSH: TSH: 2.23 mIU/L

## 2021-10-10 LAB — CBC WITH DIFFERENTIAL/PLATELET
Absolute Monocytes: 540 cells/uL (ref 200–950)
Basophils Absolute: 33 cells/uL (ref 0–200)
Basophils Relative: 0.5 %
Eosinophils Absolute: 247 cells/uL (ref 15–500)
Eosinophils Relative: 3.8 %
HCT: 42.2 % (ref 35.0–45.0)
Hemoglobin: 14.1 g/dL (ref 11.7–15.5)
Lymphs Abs: 1989 cells/uL (ref 850–3900)
MCH: 29.5 pg (ref 27.0–33.0)
MCHC: 33.4 g/dL (ref 32.0–36.0)
MCV: 88.3 fL (ref 80.0–100.0)
MPV: 11.4 fL (ref 7.5–12.5)
Monocytes Relative: 8.3 %
Neutro Abs: 3692 cells/uL (ref 1500–7800)
Neutrophils Relative %: 56.8 %
Platelets: 248 10*3/uL (ref 140–400)
RBC: 4.78 10*6/uL (ref 3.80–5.10)
RDW: 12.1 % (ref 11.0–15.0)
Total Lymphocyte: 30.6 %
WBC: 6.5 10*3/uL (ref 3.8–10.8)

## 2021-10-10 LAB — COMPLETE METABOLIC PANEL WITH GFR
AG Ratio: 1.8 (calc) (ref 1.0–2.5)
ALT: 21 U/L (ref 6–29)
AST: 15 U/L (ref 10–30)
Albumin: 4.6 g/dL (ref 3.6–5.1)
Alkaline phosphatase (APISO): 51 U/L (ref 31–125)
BUN: 14 mg/dL (ref 7–25)
CO2: 27 mmol/L (ref 20–32)
Calcium: 9.3 mg/dL (ref 8.6–10.2)
Chloride: 105 mmol/L (ref 98–110)
Creat: 0.77 mg/dL (ref 0.50–0.97)
Globulin: 2.6 g/dL (calc) (ref 1.9–3.7)
Glucose, Bld: 88 mg/dL (ref 65–99)
Potassium: 4.1 mmol/L (ref 3.5–5.3)
Sodium: 139 mmol/L (ref 135–146)
Total Bilirubin: 0.5 mg/dL (ref 0.2–1.2)
Total Protein: 7.2 g/dL (ref 6.1–8.1)
eGFR: 104 mL/min/{1.73_m2} (ref >=60)

## 2023-05-08 ENCOUNTER — Ambulatory Visit (INDEPENDENT_AMBULATORY_CARE_PROVIDER_SITE_OTHER): Payer: BC Managed Care – PPO | Admitting: Family Medicine

## 2023-05-08 ENCOUNTER — Encounter: Payer: Self-pay | Admitting: Family Medicine

## 2023-05-08 VITALS — BP 120/72 | HR 102 | Temp 98.5°F | Ht 67.0 in | Wt 198.4 lb

## 2023-05-08 DIAGNOSIS — Z0000 Encounter for general adult medical examination without abnormal findings: Secondary | ICD-10-CM

## 2023-05-08 DIAGNOSIS — R5383 Other fatigue: Secondary | ICD-10-CM | POA: Diagnosis not present

## 2023-05-08 DIAGNOSIS — Z0001 Encounter for general adult medical examination with abnormal findings: Secondary | ICD-10-CM | POA: Diagnosis not present

## 2023-05-08 LAB — CBC WITH DIFFERENTIAL/PLATELET
Absolute Monocytes: 552 cells/uL (ref 200–950)
Basophils Absolute: 28 cells/uL (ref 0–200)
Basophils Relative: 0.4 %
Eosinophils Absolute: 262 cells/uL (ref 15–500)
Eosinophils Relative: 3.8 %
HCT: 41.6 % (ref 35.0–45.0)
Hemoglobin: 13.6 g/dL (ref 11.7–15.5)
Lymphs Abs: 1939 cells/uL (ref 850–3900)
MCH: 28.5 pg (ref 27.0–33.0)
MCHC: 32.7 g/dL (ref 32.0–36.0)
MCV: 87.2 fL (ref 80.0–100.0)
MPV: 11.9 fL (ref 7.5–12.5)
Monocytes Relative: 8 %
Neutro Abs: 4119 cells/uL (ref 1500–7800)
Neutrophils Relative %: 59.7 %
Platelets: 247 10*3/uL (ref 140–400)
RBC: 4.77 10*6/uL (ref 3.80–5.10)
RDW: 13.4 % (ref 11.0–15.0)
Total Lymphocyte: 28.1 %
WBC: 6.9 10*3/uL (ref 3.8–10.8)

## 2023-05-08 MED ORDER — TRETINOIN 0.025 % EX CREA: TOPICAL_CREAM | Freq: Every day | CUTANEOUS | 11 refills | Status: AC

## 2023-05-08 NOTE — Progress Notes (Signed)
Subjective:    Patient ID: Amanda Greer, female    DOB: 12/14/1985, 37 y.o.   MRN: [id number]  HPI  Patient is a very pleasant 37 year old Caucasian woman here today for a physical exam.  She recently saw her gynecologist and had her testosterone level checked.  Her Pap smear was performed.  She has a testosterone level checked due to acne on her chest and on her face as well as hirsutism on her face.  The testosterone level is normal.  She is currently on a levonorgestrel containing IUD for birth control.  She is trying benzoyl peroxide for acne.  She does have a remote history of hyperthyroidism.  She has not had her TSH checked in quite some time.  Unfortunately her father recently had a heart attack in his 55s.  Her paternal aunt had a heart attack in her 62s.  Her paternal grandfather also had a heart attack.  Therefore her family history is very concerning for premature cardiovascular disease.  All of these individuals smoke.  She does not smoke. Past Medical History:  Diagnosis Date   Anxiety 2010   Bell's palsy    Past Surgical History:  Procedure Laterality Date   COSMETIC SURGERY N/A    [employer] 05/18/2020    No current outpatient medications on file prior to visit.   No current facility-administered medications on file prior to visit.   No Known Allergies Social History   Socioeconomic History   Marital status: Single    Spouse name: Not on file   Number of children: Not on file   Years of education: Not on file   Highest education level: Not on file  Occupational History   Not on file  Tobacco Use   Smoking status: Never   Smokeless tobacco: Never  Substance and Sexual Activity   Alcohol use: No   Drug use: No   Sexual activity: Yes    Birth control/protection: I.U.D.  Other Topics Concern   Not on file  Social History Narrative   Entered 05/2014:    At home, It is patient's daughter, and daughter's father. It is the 3 of them at home.   Patient currently  working at a bank.   Social Determinants of Health   Financial Resource Strain: Not on file  Food Insecurity: Not on file  Transportation Needs: Not on file  Physical Activity: Not on file  Stress: Not on file  Social Connections: Not on file  Intimate Partner Violence: Not on file   Family History  Problem Relation Age of Onset   Hyperlipidemia Mother    Hypertension Mother       Review of Systems  All other systems reviewed and are negative.      Objective:   Physical Exam Vitals reviewed.  Constitutional:      General: She is not in acute distress.    Appearance: Normal appearance. She is normal weight. She is not ill-appearing or toxic-appearing.  HENT:     Head: Normocephalic and atraumatic.     Right Ear: Tympanic membrane and ear canal normal.     Left Ear: Tympanic membrane and ear canal normal.     Nose: Nose normal. No congestion or rhinorrhea.     Mouth/Throat:     Mouth: Mucous membranes are moist.     Pharynx: Oropharynx is clear. No oropharyngeal exudate or posterior oropharyngeal erythema.  Eyes:     General: No scleral icterus.       Right  eye: No discharge.        Left eye: No discharge.     Extraocular Movements: Extraocular movements intact.     Conjunctiva/sclera: Conjunctivae normal.     Pupils: Pupils are equal, round, and reactive to light.  Neck:     Vascular: No carotid bruit.  Cardiovascular:     Rate and Rhythm: Normal rate and regular rhythm.     Pulses: Normal pulses.     Heart sounds: Normal heart sounds. No murmur heard.    No friction rub. No gallop.  Pulmonary:     Effort: Pulmonary effort is normal. No respiratory distress.     Breath sounds: Normal breath sounds. No stridor. No wheezing, rhonchi or rales.  Chest:     Chest wall: No tenderness.  Abdominal:     General: Bowel sounds are normal. There is no distension.     Palpations: Abdomen is soft. There is no mass.     Tenderness: There is no abdominal tenderness. There  is no guarding or rebound.     Hernia: No hernia is present.  Musculoskeletal:     Cervical back: Neck supple.     Right lower leg: No edema.     Left lower leg: No edema.  Lymphadenopathy:     Cervical: No cervical adenopathy.  Skin:    Coloration: Skin is not jaundiced.     Findings: No erythema, lesion or rash.  Neurological:     General: No focal deficit present.     Mental Status: She is alert and oriented to person, place, and time. Mental status is at baseline.     Cranial Nerves: No cranial nerve deficit.     Sensory: No sensory deficit.     Motor: No weakness.     Coordination: Coordination normal.     Gait: Gait normal.     Deep Tendon Reflexes: Reflexes normal.  Psychiatric:        Mood and Affect: Mood normal.        Behavior: Behavior normal.        Thought Content: Thought content normal.        Judgment: Judgment normal.           Assessment & Plan:  General medical exam - Plan: CBC with Differential/Platelet, COMPLETE METABOLIC PANEL WITH GFR, Lipid panel  Fatigue, unspecified type - Plan: TSH I will check a CBC, CMP, and a fasting lipid panel.  Given her family history I plan to aggressively treat her cholesterol if elevated.  Consider a lipoprotein a level if there is debate about whether she should take something for her cholesterol.  Could also consider a coronary artery calcium score as she gets older.  Check a TSH given fatigue that she is having and to monitor for any evidence of hypothyroidism.  She declines a tetanus shot.  Pap smear is up-to-date.  We did discuss options for hirsutism and acne.  She could switch her IUD to norethindrone containing birth control.  She does not want to make that change at present time.  We discussed finasteride and spironolactone however the patient is comfortable not treating the hirsutism at present.  We will try Retin-A cream for her acne.

## 2023-09-22 ENCOUNTER — Ambulatory Visit: Payer: BC Managed Care – PPO

## 2024-05-08 ENCOUNTER — Ambulatory Visit: Payer: BC Managed Care – PPO | Admitting: Family Medicine

## 2024-05-08 ENCOUNTER — Encounter: Payer: Self-pay | Admitting: Family Medicine

## 2024-05-08 VITALS — BP 120/72 | HR 84 | Temp 98.7°F | Ht 67.0 in | Wt 194.0 lb

## 2024-05-08 DIAGNOSIS — E7800 Pure hypercholesterolemia, unspecified: Secondary | ICD-10-CM | POA: Diagnosis not present

## 2024-05-08 DIAGNOSIS — R5383 Other fatigue: Secondary | ICD-10-CM | POA: Diagnosis not present

## 2024-05-08 DIAGNOSIS — Z0000 Encounter for general adult medical examination without abnormal findings: Secondary | ICD-10-CM

## 2024-05-08 NOTE — Progress Notes (Signed)
 Subjective:    Patient ID: Amanda Greer, female    DOB: 1986/02/26, 38 y.o.   MRN: [id number]  HPI  Patient is a very pleasant 38 year old Caucasian woman here today for a physical exam.  Her father recently had a heart attack in his 37s.  Her paternal aunt had a heart attack in her 32s.  Her paternal grandfather also had a heart attack.  Therefore her family history is very concerning for premature cardiovascular disease.  All of these individuals smoke.  She does not smoke.  She does have a history of hld, see labs from 2024.  Patient has not been taking fish oil.  She denies any chest pain or shortness of breath.  She is scheduling an appointment to see her gynecologist for her Pap smear. Past Medical History:  Diagnosis Date   Anxiety 2010   Bell's palsy    Past Surgical History:  Procedure Laterality Date   COSMETIC SURGERY N/A    [facility] 05/18/2020    Current Outpatient Medications on File Prior to Visit  Medication Sig Dispense Refill   tretinoin (RETIN-A) 0.025 % cream Apply topically at bedtime. 45 g 11   No current facility-administered medications on file prior to visit.   No Known Allergies Social History   Socioeconomic History   Marital status: Single    Spouse name: Not on file   Number of children: Not on file   Years of education: Not on file   Highest education level: Some college, no degree  Occupational History   Not on file  Tobacco Use   Smoking status: Never   Smokeless tobacco: Never  Substance and Sexual Activity   Alcohol use: No   Drug use: No   Sexual activity: Yes    Birth control/protection: I.U.D.  Other Topics Concern   Not on file  Social History Narrative   Entered 05/2014:    At home, It is patient's daughter, and daughter's father. It is the 3 of them at home.   Patient currently working at a bank.   Social Drivers of Corporate investment banker Strain: Low Risk  (05/04/2024)   Overall Financial Resource Strain (CARDIA)     Difficulty of Paying Living Expenses: Not hard at all  Food Insecurity: No Food Insecurity (05/04/2024)   Hunger Vital Sign    Worried About Running Out of Food in the Last Year: Never true    Ran Out of Food in the Last Year: Never true  Transportation Needs: No Transportation Needs (05/04/2024)   PRAPARE - Administrator, Civil Service (Medical): No    Lack of Transportation (Non-Medical): No  Physical Activity: Insufficiently Active (05/04/2024)   Exercise Vital Sign    Days of Exercise per Week: 2 days    Minutes of Exercise per Session: 10 min  Stress: No Stress Concern Present (05/04/2024)   [employer] of Occupational Health - Occupational Stress Questionnaire    Feeling of Stress: Only a little  Social Connections: Socially Isolated (05/04/2024)   Social Connection and Isolation Panel    Frequency of Communication with Friends and Family: Once a week    Frequency of Social Gatherings with Friends and Family: Once a week    Attends Religious Services: Never    Database administrator or Organizations: No    Attends Engineer, structural: Not on file    Marital Status: Living with partner  Intimate Partner Violence: Not on file   Family  History  Problem Relation Age of Onset   Hyperlipidemia Mother    Hypertension Mother    Heart disease Father    Heart disease Paternal Aunt    Heart disease Paternal Grandfather       Review of Systems  All other systems reviewed and are negative.      Objective:   Physical Exam Vitals reviewed.  Constitutional:      General: She is not in acute distress.    Appearance: Normal appearance. She is normal weight. She is not ill-appearing or toxic-appearing.  HENT:     Head: Normocephalic and atraumatic.     Right Ear: Tympanic membrane and ear canal normal.     Left Ear: Tympanic membrane and ear canal normal.     Nose: Nose normal. No congestion or rhinorrhea.     Mouth/Throat:     Mouth: Mucous membranes  are moist.     Pharynx: Oropharynx is clear. No oropharyngeal exudate or posterior oropharyngeal erythema.  Eyes:     General: No scleral icterus.       Right eye: No discharge.        Left eye: No discharge.     Extraocular Movements: Extraocular movements intact.     Conjunctiva/sclera: Conjunctivae normal.     Pupils: Pupils are equal, round, and reactive to light.  Neck:     Vascular: No carotid bruit.  Cardiovascular:     Rate and Rhythm: Normal rate and regular rhythm.     Pulses: Normal pulses.     Heart sounds: Normal heart sounds. No murmur heard.    No friction rub. No gallop.  Pulmonary:     Effort: Pulmonary effort is normal. No respiratory distress.     Breath sounds: Normal breath sounds. No stridor. No wheezing, rhonchi or rales.  Chest:     Chest wall: No tenderness.  Abdominal:     General: Bowel sounds are normal. There is no distension.     Palpations: Abdomen is soft. There is no mass.     Tenderness: There is no abdominal tenderness. There is no guarding or rebound.     Hernia: No hernia is present.  Musculoskeletal:     Cervical back: Neck supple.     Right lower leg: No edema.     Left lower leg: No edema.  Lymphadenopathy:     Cervical: No cervical adenopathy.  Skin:    Coloration: Skin is not jaundiced.     Findings: No erythema, lesion or rash.  Neurological:     General: No focal deficit present.     Mental Status: She is alert and oriented to person, place, and time. Mental status is at baseline.     Cranial Nerves: No cranial nerve deficit.     Sensory: No sensory deficit.     Motor: No weakness.     Coordination: Coordination normal.     Gait: Gait normal.     Deep Tendon Reflexes: Reflexes normal.  Psychiatric:        Mood and Affect: Mood normal.        Behavior: Behavior normal.        Thought Content: Thought content normal.        Judgment: Judgment normal.    Patient does have an atypical mole medial and inferior to her right  scapula.  Recommended dermatology consultation       Assessment & Plan:  General medical exam - Plan: CBC with Differential/Platelet, Comprehensive metabolic panel with  GFR, Lipid panel, TSH  Fatigue, unspecified type - Plan: CBC with Differential/Platelet, Comprehensive metabolic panel with GFR, TSH  Pure hypercholesterolemia - Plan: Lipid panel We will check CBC CMP lipid panel TSH.  Ideally if her LDL to be less than 100.  If her LDL is extremely high, I would recommend getting a coronary artery calcium score given her family history and elevated would recommend starting a statin despite her relatively young age.  Offered a tetanus shot which she politely declined

## 2024-05-09 ENCOUNTER — Ambulatory Visit: Payer: Self-pay | Admitting: Family Medicine

## 2024-05-09 LAB — COMPREHENSIVE METABOLIC PANEL WITH GFR
AG Ratio: 1.9 (calc) (ref 1.0–2.5)
ALT: 29 U/L (ref 6–29)
AST: 17 U/L (ref 10–30)
Albumin: 4.6 g/dL (ref 3.6–5.1)
Alkaline phosphatase (APISO): 52 U/L (ref 31–125)
BUN: 15 mg/dL (ref 7–25)
CO2: 24 mmol/L (ref 20–32)
Calcium: 8.8 mg/dL (ref 8.6–10.2)
Chloride: 105 mmol/L (ref 98–110)
Creat: 0.65 mg/dL (ref 0.50–0.97)
Globulin: 2.4 g/dL (ref 1.9–3.7)
Glucose, Bld: 92 mg/dL (ref 65–99)
Potassium: 4 mmol/L (ref 3.5–5.3)
Sodium: 138 mmol/L (ref 135–146)
Total Bilirubin: 0.4 mg/dL (ref 0.2–1.2)
Total Protein: 7 g/dL (ref 6.1–8.1)
eGFR: 117 mL/min/1.73m2 (ref >=60)

## 2024-05-09 LAB — CBC WITH DIFFERENTIAL/PLATELET
Absolute Lymphocytes: 1840 {cells}/uL (ref 850–3900)
Absolute Monocytes: 423 {cells}/uL (ref 200–950)
Basophils Absolute: 33 {cells}/uL (ref 0–200)
Basophils Relative: 0.5 %
Eosinophils Absolute: 241 {cells}/uL (ref 15–500)
Eosinophils Relative: 3.7 %
HCT: 43.2 % (ref 35.0–45.0)
Hemoglobin: 14.1 g/dL (ref 11.7–15.5)
MCH: 29.8 pg (ref 27.0–33.0)
MCHC: 32.6 g/dL (ref 32.0–36.0)
MCV: 91.3 fL (ref 80.0–100.0)
MPV: 11.4 fL (ref 7.5–12.5)
Monocytes Relative: 6.5 %
Neutro Abs: 3965 {cells}/uL (ref 1500–7800)
Neutrophils Relative %: 61 %
Platelets: 274 Thousand/uL (ref 140–400)
RBC: 4.73 Million/uL (ref 3.80–5.10)
RDW: 12.5 % (ref 11.0–15.0)
Total Lymphocyte: 28.3 %
WBC: 6.5 Thousand/uL (ref 3.8–10.8)

## 2024-05-09 LAB — LIPID PANEL
Cholesterol: 212 mg/dL — ABNORMAL HIGH (ref <200)
HDL: 44 mg/dL — ABNORMAL LOW (ref >=50)
LDL Cholesterol (Calc): 137 mg/dL — ABNORMAL HIGH
Non-HDL Cholesterol (Calc): 168 mg/dL — ABNORMAL HIGH (ref <130)
Total CHOL/HDL Ratio: 4.8 (calc) (ref <5.0)
Triglycerides: 174 mg/dL — ABNORMAL HIGH (ref <150)

## 2024-05-09 LAB — TSH: TSH: 2.06 m[IU]/L

## 2024-05-13 ENCOUNTER — Other Ambulatory Visit: Payer: Self-pay | Admitting: Family Medicine

## 2024-05-13 DIAGNOSIS — E7800 Pure hypercholesterolemia, unspecified: Secondary | ICD-10-CM

## 2024-06-13 ENCOUNTER — Ambulatory Visit (HOSPITAL_BASED_OUTPATIENT_CLINIC_OR_DEPARTMENT_OTHER)
Admission: RE | Admit: 2024-06-13 | Discharge: 2024-06-13 | Disposition: A | Discharge status: Home / Self Care | Payer: Self-pay | Source: Ambulatory Visit | Attending: Family Medicine | Admitting: Family Medicine

## 2024-06-13 DIAGNOSIS — E7800 Pure hypercholesterolemia, unspecified: Secondary | ICD-10-CM | POA: Insufficient documentation

## 2024-06-16 ENCOUNTER — Ambulatory Visit: Payer: Self-pay | Admitting: Family Medicine

## 2025-05-11 ENCOUNTER — Encounter: Admitting: Family Medicine
